# Patient Record
Sex: Female | Born: 1960 | Race: White | Hispanic: No | Marital: Married | Smoking: Never smoker
Health system: Southern US, Community
[De-identification: ages and names within clinical notes are randomized; demographics above are authoritative.]

## PROBLEM LIST (undated history)

## (undated) DIAGNOSIS — C801 Malignant (primary) neoplasm, unspecified: Secondary | ICD-10-CM

## (undated) DIAGNOSIS — N644 Mastodynia: Secondary | ICD-10-CM

## (undated) HISTORY — DX: Mastodynia: N64.4

## (undated) HISTORY — DX: Malignant (primary) neoplasm, unspecified: C80.1

---

## 2008-10-19 ENCOUNTER — Encounter: Payer: Self-pay | Admitting: Internal Medicine

## 2008-10-26 ENCOUNTER — Encounter: Payer: Self-pay | Admitting: Internal Medicine

## 2008-12-17 ENCOUNTER — Ambulatory Visit: Payer: Self-pay | Admitting: Internal Medicine

## 2008-12-17 DIAGNOSIS — J309 Allergic rhinitis, unspecified: Secondary | ICD-10-CM | POA: Insufficient documentation

## 2008-12-22 ENCOUNTER — Telehealth: Payer: Self-pay | Admitting: Internal Medicine

## 2009-01-24 ENCOUNTER — Ambulatory Visit: Payer: Self-pay | Admitting: Internal Medicine

## 2009-01-25 ENCOUNTER — Ambulatory Visit: Payer: Self-pay | Admitting: Cardiology

## 2009-01-25 ENCOUNTER — Ambulatory Visit: Payer: Self-pay | Admitting: Internal Medicine

## 2009-01-25 ENCOUNTER — Telehealth: Payer: Self-pay | Admitting: Internal Medicine

## 2009-01-26 ENCOUNTER — Ambulatory Visit: Payer: Self-pay | Admitting: Internal Medicine

## 2009-02-18 ENCOUNTER — Ambulatory Visit: Payer: Self-pay | Admitting: Internal Medicine

## 2009-02-21 ENCOUNTER — Telehealth (INDEPENDENT_AMBULATORY_CARE_PROVIDER_SITE_OTHER): Payer: Self-pay | Admitting: *Deleted

## 2009-03-10 ENCOUNTER — Telehealth (INDEPENDENT_AMBULATORY_CARE_PROVIDER_SITE_OTHER): Payer: Self-pay | Admitting: *Deleted

## 2009-03-16 ENCOUNTER — Ambulatory Visit: Payer: Self-pay | Admitting: Internal Medicine

## 2009-04-14 ENCOUNTER — Ambulatory Visit: Payer: Self-pay | Admitting: Internal Medicine

## 2009-04-19 ENCOUNTER — Encounter: Payer: Self-pay | Admitting: Internal Medicine

## 2009-04-21 ENCOUNTER — Encounter: Admission: RE | Admit: 2009-04-21 | Discharge: 2009-04-21 | Payer: Self-pay | Admitting: Family Medicine

## 2009-04-25 ENCOUNTER — Encounter (INDEPENDENT_AMBULATORY_CARE_PROVIDER_SITE_OTHER): Payer: Self-pay | Admitting: Diagnostic Radiology

## 2009-04-25 ENCOUNTER — Encounter: Admission: RE | Admit: 2009-04-25 | Discharge: 2009-04-25 | Payer: Self-pay | Admitting: Family Medicine

## 2009-04-25 ENCOUNTER — Ambulatory Visit: Payer: Self-pay | Admitting: Internal Medicine

## 2009-05-03 ENCOUNTER — Encounter: Admission: RE | Admit: 2009-05-03 | Discharge: 2009-05-03 | Payer: Self-pay | Admitting: Family Medicine

## 2009-05-03 ENCOUNTER — Encounter (INDEPENDENT_AMBULATORY_CARE_PROVIDER_SITE_OTHER): Payer: Self-pay | Admitting: Diagnostic Radiology

## 2009-05-11 ENCOUNTER — Ambulatory Visit: Payer: Self-pay | Admitting: Genetic Counselor

## 2009-05-25 ENCOUNTER — Encounter: Admission: RE | Admit: 2009-05-25 | Discharge: 2009-05-25 | Payer: Self-pay | Admitting: Surgery

## 2009-05-26 ENCOUNTER — Encounter (INDEPENDENT_AMBULATORY_CARE_PROVIDER_SITE_OTHER): Payer: Self-pay | Admitting: Surgery

## 2009-05-26 ENCOUNTER — Ambulatory Visit (HOSPITAL_BASED_OUTPATIENT_CLINIC_OR_DEPARTMENT_OTHER): Admission: RE | Admit: 2009-05-26 | Discharge: 2009-05-27 | Payer: Self-pay | Admitting: Surgery

## 2009-05-26 HISTORY — PX: MASTECTOMY: SHX3

## 2009-06-07 ENCOUNTER — Telehealth (INDEPENDENT_AMBULATORY_CARE_PROVIDER_SITE_OTHER): Payer: Self-pay | Admitting: *Deleted

## 2009-06-15 ENCOUNTER — Ambulatory Visit: Payer: Self-pay | Admitting: Internal Medicine

## 2009-06-15 ENCOUNTER — Ambulatory Visit: Payer: Self-pay | Admitting: Oncology

## 2009-06-15 LAB — VITAMIN D 25 HYDROXY (VIT D DEFICIENCY, FRACTURES): Vit D, 25-Hydroxy: 43 ng/mL (ref 30–89)

## 2009-06-15 LAB — COMPREHENSIVE METABOLIC PANEL
AST: 13 U/L (ref 0–37)
Albumin: 4.5 g/dL (ref 3.5–5.2)
BUN: 10 mg/dL (ref 6–23)
CO2: 25 mEq/L (ref 19–32)
Calcium: 9.8 mg/dL (ref 8.4–10.5)
Chloride: 105 mEq/L (ref 96–112)
Glucose, Bld: 91 mg/dL (ref 70–99)
Potassium: 4.2 mEq/L (ref 3.5–5.3)

## 2009-06-15 LAB — CBC WITH DIFFERENTIAL/PLATELET
Basophils Absolute: 0 10*3/uL (ref 0.0–0.1)
Eosinophils Absolute: 0.1 10*3/uL (ref 0.0–0.5)
HGB: 12.5 g/dL (ref 11.6–15.9)
MONO#: 0.5 10*3/uL (ref 0.1–0.9)
NEUT#: 7.1 10*3/uL — ABNORMAL HIGH (ref 1.5–6.5)
RDW: 13.1 % (ref 11.2–14.5)
lymph#: 1.7 10*3/uL (ref 0.9–3.3)

## 2009-06-15 LAB — LACTATE DEHYDROGENASE: LDH: 122 U/L (ref 94–250)

## 2009-06-15 LAB — CANCER ANTIGEN 27.29: CA 27.29: 22 U/mL (ref 0–39)

## 2009-06-23 HISTORY — PX: UTERINE FIBROID SURGERY: SHX826

## 2009-06-29 ENCOUNTER — Ambulatory Visit (HOSPITAL_COMMUNITY): Admission: RE | Admit: 2009-06-29 | Discharge: 2009-06-29 | Payer: Self-pay | Admitting: Oncology

## 2009-07-04 ENCOUNTER — Encounter: Payer: Self-pay | Admitting: Internal Medicine

## 2009-07-04 ENCOUNTER — Ambulatory Visit (HOSPITAL_COMMUNITY): Admission: RE | Admit: 2009-07-04 | Discharge: 2009-07-04 | Payer: Self-pay | Admitting: Oncology

## 2009-07-06 ENCOUNTER — Ambulatory Visit: Payer: Self-pay | Admitting: Internal Medicine

## 2009-08-01 ENCOUNTER — Ambulatory Visit: Payer: Self-pay | Admitting: Oncology

## 2009-08-03 ENCOUNTER — Encounter: Payer: Self-pay | Admitting: Internal Medicine

## 2009-08-03 LAB — CBC WITH DIFFERENTIAL/PLATELET
BASO%: 0.7 % (ref 0.0–2.0)
EOS%: 1.5 % (ref 0.0–7.0)
Eosinophils Absolute: 0.1 10*3/uL (ref 0.0–0.5)
MCHC: 33.7 g/dL (ref 31.5–36.0)
MCV: 87.1 fL (ref 79.5–101.0)
MONO%: 9.6 % (ref 0.0–14.0)
NEUT#: 3.5 10*3/uL (ref 1.5–6.5)
RBC: 4.26 10*6/uL (ref 3.70–5.45)
RDW: 13.1 % (ref 11.2–14.5)

## 2009-08-03 LAB — FOLLICLE STIMULATING HORMONE: FSH: 2.9 m[IU]/mL

## 2009-08-03 LAB — BASIC METABOLIC PANEL
CO2: 27 mEq/L (ref 19–32)
Calcium: 9.8 mg/dL (ref 8.4–10.5)
Sodium: 142 mEq/L (ref 135–145)

## 2009-08-11 LAB — ESTRADIOL, ULTRA SENS: Estradiol, Ultra Sensitive: 33 pg/mL

## 2009-08-31 ENCOUNTER — Ambulatory Visit: Payer: Self-pay | Admitting: Oncology

## 2009-09-02 LAB — COMPREHENSIVE METABOLIC PANEL
ALT: 16 U/L (ref 0–35)
CO2: 25 mEq/L (ref 19–32)
Calcium: 9.2 mg/dL (ref 8.4–10.5)
Chloride: 103 mEq/L (ref 96–112)
Creatinine, Ser: 0.67 mg/dL (ref 0.40–1.20)
Glucose, Bld: 128 mg/dL — ABNORMAL HIGH (ref 70–99)
Total Protein: 6.8 g/dL (ref 6.0–8.3)

## 2009-09-02 LAB — CBC WITH DIFFERENTIAL/PLATELET
Basophils Absolute: 0 10*3/uL (ref 0.0–0.1)
EOS%: 1.7 % (ref 0.0–7.0)
HGB: 13.5 g/dL (ref 11.6–15.9)
MCH: 29.2 pg (ref 25.1–34.0)
MCV: 86.6 fL (ref 79.5–101.0)
MONO%: 7.9 % (ref 0.0–14.0)
RBC: 4.62 10*6/uL (ref 3.70–5.45)
RDW: 12.9 % (ref 11.2–14.5)

## 2009-09-02 LAB — CANCER ANTIGEN 27.29: CA 27.29: 13 U/mL (ref 0–39)

## 2009-09-02 LAB — LACTATE DEHYDROGENASE: LDH: 129 U/L (ref 94–250)

## 2009-09-13 LAB — ESTRADIOL, ULTRA SENS: Estradiol, Ultra Sensitive: 2 pg/mL

## 2009-09-28 ENCOUNTER — Ambulatory Visit: Payer: Self-pay | Admitting: Oncology

## 2010-07-03 IMAGING — CT CT ABDOMEN W/ CM
2 of 8 series · 14 of 46 positions shown, 18 images · IV contrast (agent unspecified)
Comparison: None

CT CHEST

CLINICAL DATA: Right-sided breast cancer, status post right
mastectomy.

CT CHEST, ABDOMEN AND PELVIS WITH CONTRAST
TECHNIQUE: Multidetector CT imaging of the chest, abdomen and
pelvis was performed following the standard protocol during bolus
administration of intravenous contrast.
Contrast: 100 ml Wmnipaque-XXX

[Series 2: cap with st · axial · 0.82mm/px · z∈[-628,-38]mm · 11 of 134 slices shown, 15 images]
[im 8/134  soft-tissue]
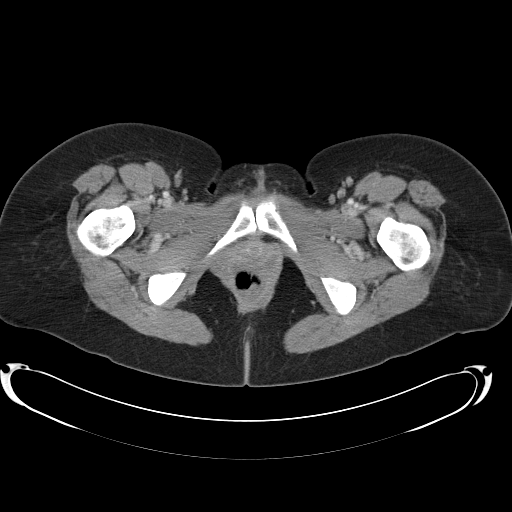
[im 8/134  bone]
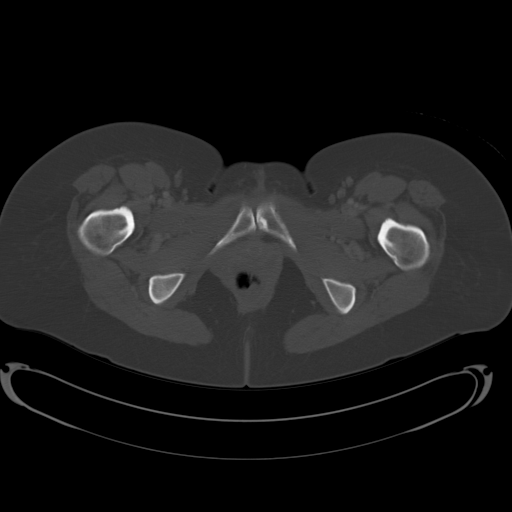
[im 24/134  soft-tissue]
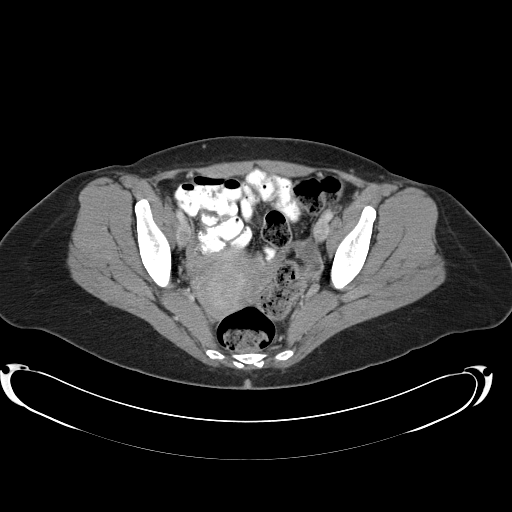
[im 40/134  soft-tissue]
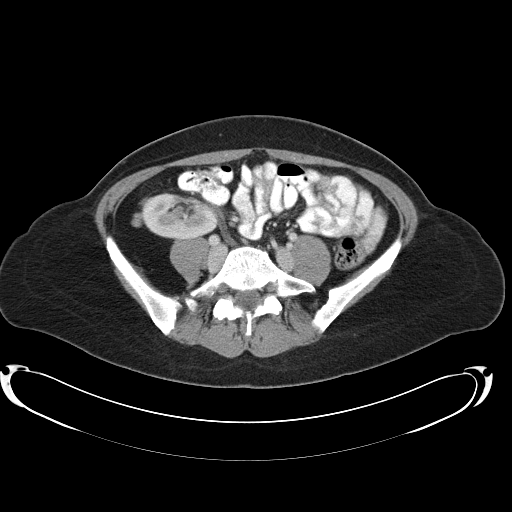
[im 55/134  soft-tissue]
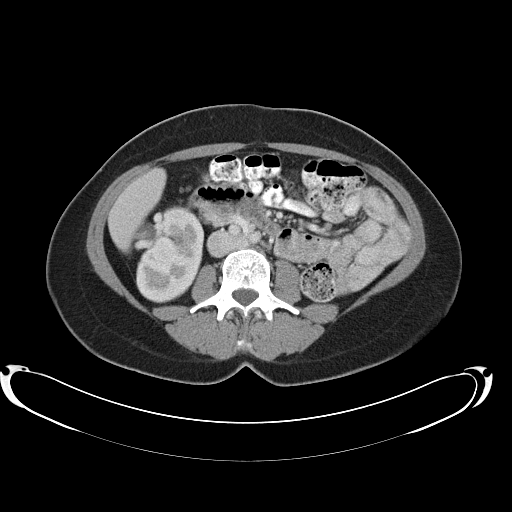
[im 71/134  soft-tissue]
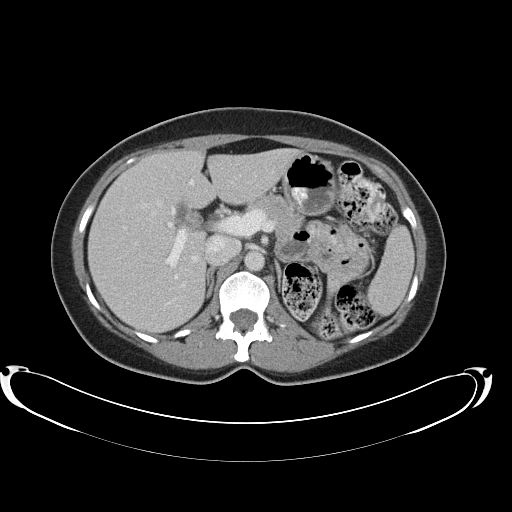
[im 79/134  soft-tissue]
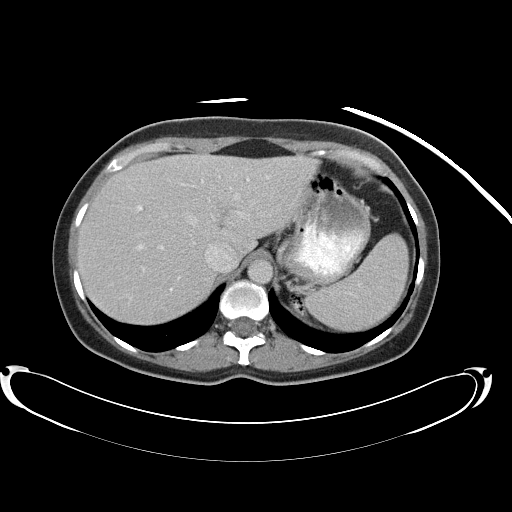
[im 94/134  soft-tissue]
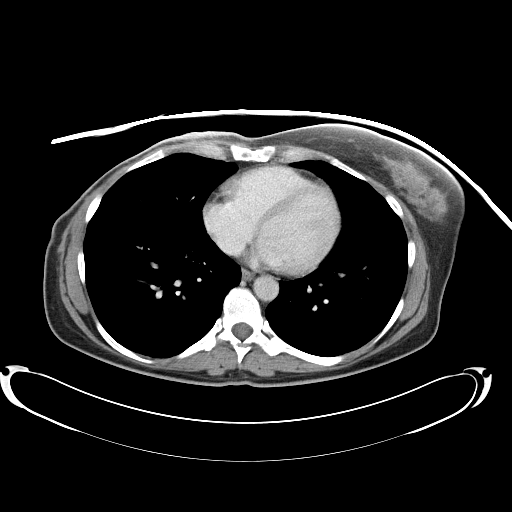
[im 102/134  lung]
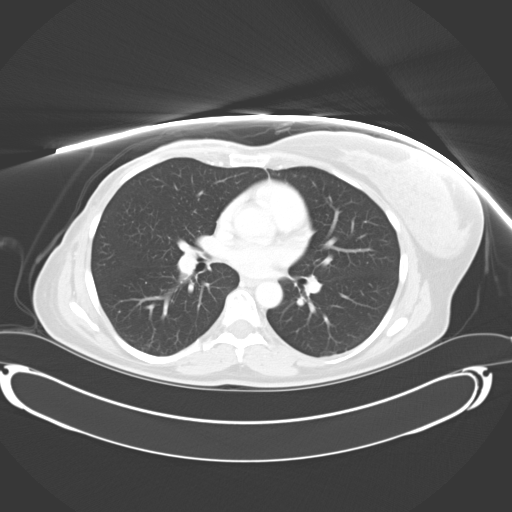
[im 110/134  soft-tissue]
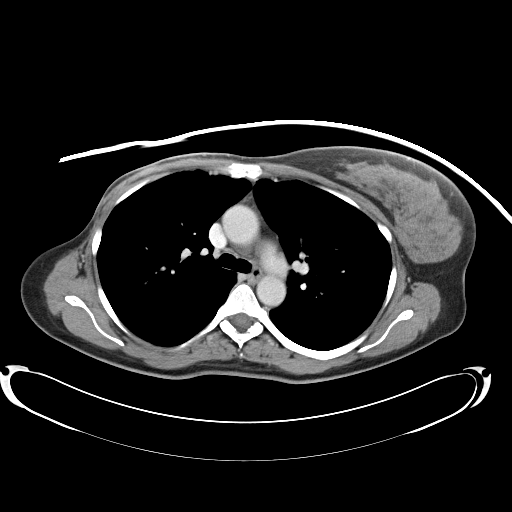
[im 110/134  lung]
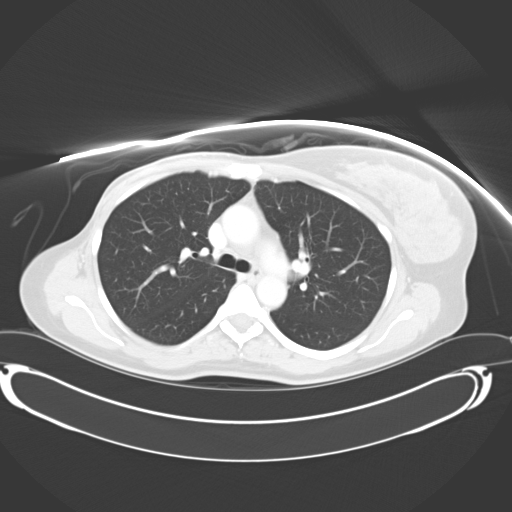
[im 118/134  lung]
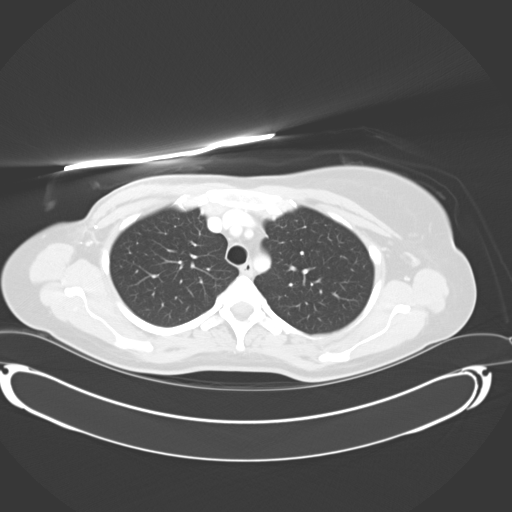
[im 126/134  soft-tissue]
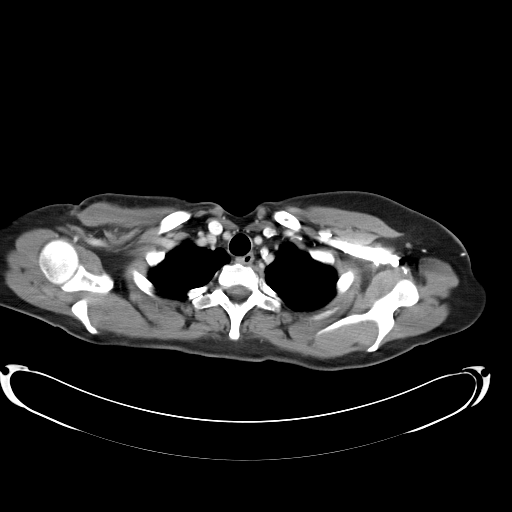
[im 126/134  lung]
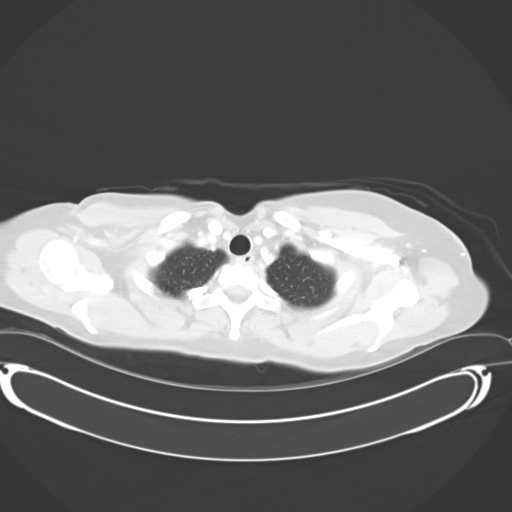
[im 126/134  bone]
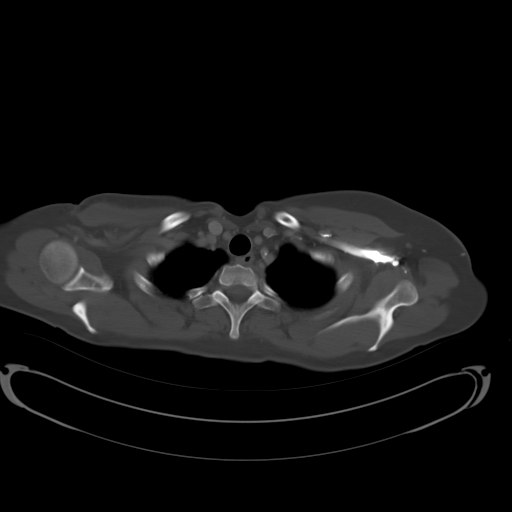

[Series 602: <mpr thick range> · coronal · 1.03mm/px · 3 of 82 slices shown]
[im 21/82  soft-tissue]
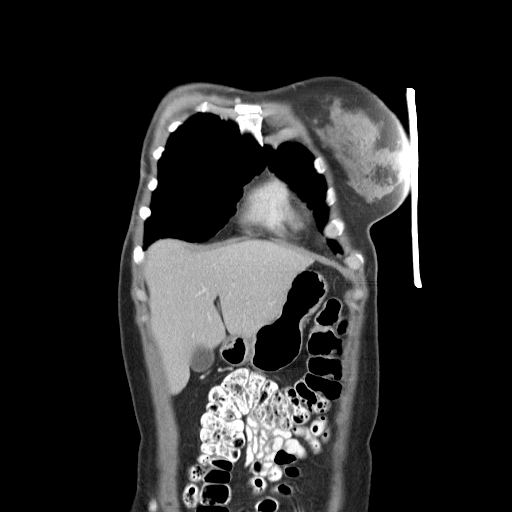
[im 41/82  soft-tissue]
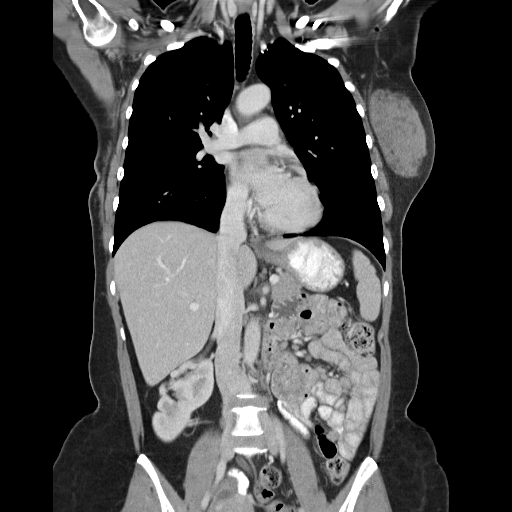
[im 61/82  soft-tissue]
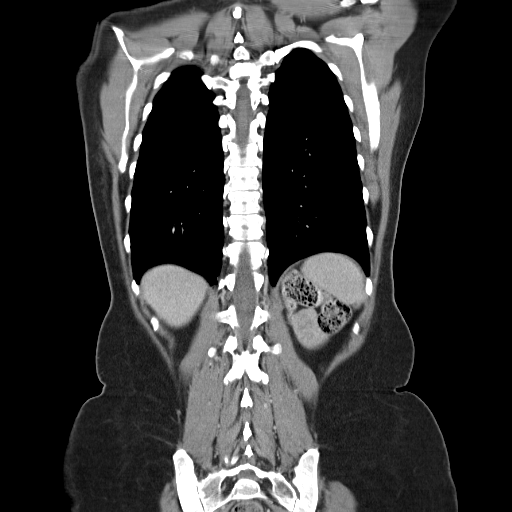

[14 of 46 positions shown; findings below may reference images not displayed]

FINDINGS: No pathologic thoracic adenopathy noted.  Right
mastectomy noted with right axillary clips.  No osseous thoracic
metastatic disease evident.  No lung mass identified.

No vascular abnormality is identified.
IMPRESSION: 1.  Right mastectomy.  No findings of residual or metastatic
malignancy.

CT ABDOMEN
FINDINGS: The liver, spleen, pancreas, and adrenal glands appear
unremarkable.

The gallbladder and biliary system appear unremarkable.

No pathologic retroperitoneal or porta hepatis adenopathy is
identified.

There is cross fused renal ectopia on the right side, without
current hydronephrosis or complicating feature.  The ureter of the
left kidney extends over to the left side in the pelvis.
IMPRESSION: 1.  No findings of metastatic disease to the upper abdomen.
2.  Crossed fused renal ectopia, right side.

CT PELVIS
FINDINGS: The appendix appears unremarkable.  Several small
Nabothian cysts are present.

The endometrial stripe measures up to 1.3 cm, and contains a 6 mm
focus of increased density on image 52 of series 606 which could
represent a polyp, small submucosal fibroid, or blood products.  If
clinically warranted, follow-up pelvic sonography in 6 weeks time
could be utilized for further characterization.

The urinary bladder appears unremarkable.  No free pelvic fluid
identified.

No pathologic pelvic adenopathy is identified.
IMPRESSION: 1.  Mildly thickened endometrial stripe with a small focus of
increased density within the endometrium - while this could
represent a small focus of blood products in the late secretory
phase, polyp or a small submucosal fibroid could appear similarly.

## 2010-10-23 ENCOUNTER — Encounter: Payer: Self-pay | Admitting: Family Medicine

## 2010-10-31 NOTE — Miscellaneous (Signed)
Summary: Injection Record/[facility]  Injection Record/[facility]   Imported By: Sherian Rein 02/21/2010 12:23:13  _____________________________________________________________________  External Attachment:    Type:   Image     Comment:   External Document

## 2010-10-31 NOTE — Miscellaneous (Signed)
Summary: Injection Orders / [facility]    Injection Orders / [facility]    Imported By: Lennie Odor 02/28/2010 16:08:14  _____________________________________________________________________  External Attachment:    Type:   Image     Comment:   External Document

## 2011-01-06 LAB — COMPREHENSIVE METABOLIC PANEL
ALT: 17 U/L (ref 0–35)
AST: 16 U/L (ref 0–37)
Albumin: 4.6 g/dL (ref 3.5–5.2)
Alkaline Phosphatase: 52 U/L (ref 39–117)
BUN: 10 mg/dL (ref 6–23)
CO2: 28 mEq/L (ref 19–32)
Creatinine, Ser: 0.56 mg/dL (ref 0.4–1.2)
Potassium: 4.3 mEq/L (ref 3.5–5.1)
Sodium: 137 mEq/L (ref 135–145)
Total Protein: 7.1 g/dL (ref 6.0–8.3)

## 2011-01-06 LAB — URINALYSIS, ROUTINE W REFLEX MICROSCOPIC
Nitrite: NEGATIVE
Specific Gravity, Urine: 1.02 (ref 1.005–1.030)

## 2011-01-06 LAB — DIFFERENTIAL
Basophils Absolute: 0 10*3/uL (ref 0.0–0.1)
Eosinophils Absolute: 0.1 10*3/uL (ref 0.0–0.7)
Eosinophils Relative: 1 % (ref 0–5)
Monocytes Relative: 7 % (ref 3–12)
Neutro Abs: 4.4 10*3/uL (ref 1.7–7.7)
Neutrophils Relative %: 64 % (ref 43–77)

## 2011-01-06 LAB — CBC
Hemoglobin: 12.9 g/dL (ref 12.0–15.0)
MCV: 87.9 fL (ref 78.0–100.0)
Platelets: 243 10*3/uL (ref 150–400)
RBC: 4.31 MIL/uL (ref 3.87–5.11)
WBC: 6.9 10*3/uL (ref 4.0–10.5)

## 2011-02-13 NOTE — Op Note (Signed)
NAMEJANITA, CAMBEROS             ACCOUNT NO.:  [id number]   MEDICAL RECORD NO.:  [id number]          PATIENT TYPE:  AMB   LOCATION:  [facility]                          FACILITY:  [facility]   PHYSICIAN:  Currie Paris, M.D.DATE OF BIRTH:  1961-07-21   DATE OF PROCEDURE:  05/26/2009  DATE OF DISCHARGE:                               OPERATIVE REPORT   PREOPERATIVE DIAGNOSIS:  Multifocal right breast cancer.   POSTOPERATIVE DIAGNOSIS:  Multifocal right breast cancer.   OPERATION:  Right modified mastectomy with blue dye injection and  sentinel lymph node biopsy.   SURGEON:  Currie Paris, MD   ANESTHESIA:  General.   CLINICAL HISTORY:  This is a 50 year old lady with a multifocal right  breast cancer.  Mastectomy was discussed and she agreed to proceed.  We  planned sentinel lymph node evaluation with possible node dissection.   DESCRIPTION OF PROCEDURE:  I saw the patient in the holding area and she  had no further questions.  Her right breast had already been injected  with radioactive isotope.  She and her family had no further questions.   The patient was taken to the operating room after satisfactory general  anesthesia had been obtained.  The time-out was done.  I injected 5 mL  of dilute methylene blue subareolarly.  The breast was then prepped and  draped.  I outlined an elliptical incision.  No reconstruction was  planned, so I tried to not leave any excess skin but to leave enough  should she have a delayed reconstruction.   The NeoProbe was used to identify a hot area in the axilla and the skin  over that was marked.   I made the superior incision and raised skin flaps medially to the  sternum, superiorly towards the clavicle and then laterally until I got  into the axilla and towards the latissimus.  At that point, I stopped  and used the Neoprobe to identify a hot area and further dissection  showed a large 2.5-cm lymph node that was bright blue and had counts  of  over 5000.  This was removed.  Two additional lymph nodes adjacent were  found, one of which had counts of 2000 and the other about 800.  These  were sent for touch preps.  I did not see any other hot nodes.   At this point, I made the inferior incision and raised the inferior skin  flap out to the inframammary fold and then laterally to latissimus.  The  breast was then removed starting medially and working laterally taking  the fascia.  I opened the clavipectoral fascia and at this point Dr.  Laureen Ochs reported that the large lymph node was positive and the other two  were therefore not checked.  With this knowledge, I continued to do the  axillary dissection.   The entire clavipectoral fascia was opened.  The attachments of the  breast to the serratus were divided and then as they entered the axilla  I dissected around so I could identify the axillary vein and then  stripped the axillary contents from medial to  lateral, superior to  inferior.  The second intercostal nerve was clipped and divided.  I  tried to preserve the blood and nerve supply to the pectoralis.  I  identified and preserved the long thoracic and thoracodorsal nerves.  I  dissected out the intervening tissue.  I detached the lateral  attachments of the breast towards the axilla making a tiny buttonhole in  the axilla.  The final lateral attachments to the latissimus were  divided and the specimen was taken off the table.   I checked and the nerves appeared to be working okay when stimulated.  I  put some Marcaine around them.  I irrigated.  I put a 19 Blake drain in  for the superior flap and one towards the axilla.  We irrigated and  again checked for hemostasis and everything was dry.  I put some Tisseel  along the chest wall and up into the axilla and then closed with staples  and Steri-Strips and secured the drains with 2-0 nylon.  I did put a  little Vicryl suture in the tiny buttonhole as that was causing an  air  leak.   Sterile dry dressings were applied.  The patient tolerated the procedure  well.  There were no operative complications.  Estimated blood loss was  about 100 mL.      Currie Paris, M.D.  Electronically Signed     CJS/MEDQ  D:  05/26/2009  T:  05/26/2009  Job:  [id number]   cc:   Jeanmarie Plant

## 2011-02-21 ENCOUNTER — Encounter: Payer: Self-pay | Admitting: Gastroenterology

## 2011-03-28 DIAGNOSIS — C50919 Malignant neoplasm of unspecified site of unspecified female breast: Secondary | ICD-10-CM | POA: Insufficient documentation

## 2011-03-30 ENCOUNTER — Telehealth (INDEPENDENT_AMBULATORY_CARE_PROVIDER_SITE_OTHER): Payer: Self-pay | Admitting: Surgery

## 2011-03-30 NOTE — Telephone Encounter (Signed)
I spoke with the patient today about her biopsy result. She was already aware that she has a new left breast cancer with axillary metastasis.  I told her that I would get up with her medical oncologist on Monday. We will need to get some input from her before making a decision on our next steps. I think she is likely to need some scans to rule out metastatic disease. In addition the prognostic indicators are still pending.

## 2011-04-08 ENCOUNTER — Telehealth (INDEPENDENT_AMBULATORY_CARE_PROVIDER_SITE_OTHER): Payer: Self-pay | Admitting: Surgery

## 2011-04-08 NOTE — Telephone Encounter (Signed)
I spoke with Dr.McCarty. The patient wishes to proceed to a modified mastectomy and, if possible a PAC. I told Dr Gilman Buttner we would try to schedule this week, but I might have to have one of my associates do the surgery. We will call the patient Monday with definitive plans

## 2011-04-09 ENCOUNTER — Telehealth (INDEPENDENT_AMBULATORY_CARE_PROVIDER_SITE_OTHER): Payer: Self-pay | Admitting: Surgery

## 2011-04-09 NOTE — Telephone Encounter (Signed)
I spoke with the patient today. She is scheduled for a modified mastectomy and Port-A-Cath placement on Wednesday. She went over the plans for that in detail with her oncologist last week. I called her to confirm the plans, and to be sure she had no further questions. She did not and fully understands the plans.

## 2011-04-10 ENCOUNTER — Other Ambulatory Visit (INDEPENDENT_AMBULATORY_CARE_PROVIDER_SITE_OTHER): Payer: Self-pay | Admitting: Surgery

## 2011-04-10 ENCOUNTER — Encounter (HOSPITAL_COMMUNITY): Payer: BC Managed Care – PPO

## 2011-04-10 LAB — BASIC METABOLIC PANEL
BUN: 10 mg/dL (ref 6–23)
CO2: 28 mEq/L (ref 19–32)
Calcium: 10 mg/dL (ref 8.4–10.5)
GFR calc non Af Amer: >60 mL/min (ref >60)
Glucose, Bld: 90 mg/dL (ref 70–99)
Potassium: 3.7 mEq/L (ref 3.5–5.1)

## 2011-04-10 LAB — URINALYSIS, ROUTINE W REFLEX MICROSCOPIC
Hgb urine dipstick: NEGATIVE
Ketones, ur: NEGATIVE mg/dL
Leukocytes, UA: NEGATIVE
Protein, ur: NEGATIVE mg/dL
Urobilinogen, UA: 0.2 mg/dL (ref 0.0–1.0)
pH: 7.5 (ref 5.0–8.0)

## 2011-04-10 LAB — SURGICAL PCR SCREEN: Staphylococcus aureus: NEGATIVE

## 2011-04-10 LAB — DIFFERENTIAL
Lymphs Abs: 1.8 10*3/uL (ref 0.7–4.0)
Monocytes Absolute: 0.5 10*3/uL (ref 0.1–1.0)
Neutro Abs: 5.1 10*3/uL (ref 1.7–7.7)

## 2011-04-10 LAB — CBC
HCT: 39.2 % (ref 36.0–46.0)
MCV: 85.6 fL (ref 78.0–100.0)
Platelets: 276 10*3/uL (ref 150–400)
RBC: 4.58 MIL/uL (ref 3.87–5.11)
RDW: 13.2 % (ref 11.5–15.5)

## 2011-04-11 ENCOUNTER — Ambulatory Visit (HOSPITAL_COMMUNITY): Payer: BC Managed Care – PPO

## 2011-04-11 ENCOUNTER — Other Ambulatory Visit (INDEPENDENT_AMBULATORY_CARE_PROVIDER_SITE_OTHER): Payer: Self-pay | Admitting: Surgery

## 2011-04-11 ENCOUNTER — Ambulatory Visit (HOSPITAL_COMMUNITY)
Admission: RE | Admit: 2011-04-11 | Discharge: 2011-04-12 | Disposition: A | Discharge status: Home / Self Care | Payer: BC Managed Care – PPO | Source: Ambulatory Visit | Attending: Surgery | Admitting: Surgery

## 2011-04-11 DIAGNOSIS — Z01812 Encounter for preprocedural laboratory examination: Secondary | ICD-10-CM | POA: Insufficient documentation

## 2011-04-11 DIAGNOSIS — C50219 Malignant neoplasm of upper-inner quadrant of unspecified female breast: Secondary | ICD-10-CM | POA: Insufficient documentation

## 2011-04-11 DIAGNOSIS — Z79899 Other long term (current) drug therapy: Secondary | ICD-10-CM | POA: Insufficient documentation

## 2011-04-11 DIAGNOSIS — C50919 Malignant neoplasm of unspecified site of unspecified female breast: Secondary | ICD-10-CM

## 2011-04-11 DIAGNOSIS — Z01810 Encounter for preprocedural cardiovascular examination: Secondary | ICD-10-CM | POA: Insufficient documentation

## 2011-04-11 HISTORY — PX: MASTECTOMY MODIFIED RADICAL: SUR848

## 2011-04-18 ENCOUNTER — Encounter (INDEPENDENT_AMBULATORY_CARE_PROVIDER_SITE_OTHER): Payer: Self-pay | Admitting: General Surgery

## 2011-04-19 ENCOUNTER — Encounter (INDEPENDENT_AMBULATORY_CARE_PROVIDER_SITE_OTHER): Payer: Self-pay | Admitting: General Surgery

## 2011-04-19 ENCOUNTER — Ambulatory Visit (INDEPENDENT_AMBULATORY_CARE_PROVIDER_SITE_OTHER): Payer: BC Managed Care – PPO | Admitting: General Surgery

## 2011-04-19 VITALS — Temp 97.6°F | Wt 145.2 lb

## 2011-04-19 DIAGNOSIS — C50919 Malignant neoplasm of unspecified site of unspecified female breast: Secondary | ICD-10-CM

## 2011-04-19 NOTE — Progress Notes (Signed)
Subjective:     Patient ID: Jodi Middleton, female   DOB: 1961-06-23, 50 y.o.   MRN: [id number]  HPI Followup breast cancer left breast and Port-A-Cath placement by Dr. Darylene Price approximately 8 days ago with   Review of Systems     Objective:   Physical Exam  Patient has had a recent mastectomy on the left with tube mastectomy drains the medial drain is draining approximately 20 cc of fluid in the last several days its clear there's no evidence of any obvious infection remove the medial drain. The lateral drain is draining clear fluid and larger quantities approximately 45 cc yesterday and I will plan on removing the drain next week. Its were replaced as a loop that of ischemia on the superior flap laterally at her foot will not be any full thickness skin loss. Her Port-A-Cath placement on the right is healing nicely and she is keeping incision dry   Assessment:        Plan:    Return in one week for removal of the left drain. I discussed with the patient she had 305 positive lymph nodes this time and she had 3 positive lymph nodes on the right breast approximately 2 years ago when she had a mastectomy on that she. She is not plan on a reconstruction incision appears to be healing satisfactory.

## 2011-04-19 NOTE — Patient Instructions (Signed)
Please keep the recording the drainage from the left axillary drain and keep incision dry and our plan our plan on seeing you in one week but if the drainage would decrease over the weekend rapidly I could see earlier to remove the left axillary drain.

## 2011-04-27 ENCOUNTER — Ambulatory Visit (INDEPENDENT_AMBULATORY_CARE_PROVIDER_SITE_OTHER): Payer: BC Managed Care – PPO | Admitting: General Surgery

## 2011-04-27 ENCOUNTER — Encounter (INDEPENDENT_AMBULATORY_CARE_PROVIDER_SITE_OTHER): Payer: Self-pay | Admitting: Surgery

## 2011-04-27 ENCOUNTER — Encounter (INDEPENDENT_AMBULATORY_CARE_PROVIDER_SITE_OTHER): Payer: Self-pay | Admitting: General Surgery

## 2011-04-27 VITALS — BP 104/80 | HR 78 | Temp 98.3°F

## 2011-04-27 DIAGNOSIS — C50919 Malignant neoplasm of unspecified site of unspecified female breast: Secondary | ICD-10-CM

## 2011-04-27 NOTE — Progress Notes (Signed)
Subjective:     Patient ID: Jodi Middleton, female   DOB: April 01, 1961, 50 y.o.   MRN: [id number]  HPIThis patient returns she is a patient of Dr. strikes who had a left mastectomy and axillary dissection approximately 2 weeks ago removed one of the Blake drains last week and she returns for removal of the second drain today. She says that she is having a small amount of air leakage but there is very little fluid within the drain reservoir and I removed the tube without problems. His a few little crusted areas along the mastectomy incision that she will apply a little Neosporin to the incision. She is supposed to start her chemotherapy next week Thursday or Friday and applied this with Dr. strikes fearful one postop visit prior to starting the chemotherapy and she may need aspiration of the axilla   Review of Systems     Objective:   Physical Exam     Assessment:         Plan:     Patient may shower starting tomorrow and apply a little Neosporin to the incision for the drain has been removed. She will return to see Dr. Consuella Lose next week for wound check and discussion of her tumor he is to start chemotherapy next Thursday or Friday by her medical oncologist

## 2011-04-27 NOTE — Patient Instructions (Signed)
He may start showering tomorrow and apply a little Neosporin to the incision where the drain was removed and the fluid scabs on the mastectomy incision

## 2011-04-27 NOTE — Op Note (Signed)
NAMECATELYN, FRIEL NO.:  [id number]  MEDICAL RECORD NO.:  [id number]  LOCATION:  [facility]                         FACILITY:  [facility]  PHYSICIAN:  Currie Paris, M.D.DATE OF BIRTH:  [birth year]  DATE OF PROCEDURE:  04/11/2011 DATE OF DISCHARGE:                              OPERATIVE REPORT   PREOPERATIVE DIAGNOSIS:  Invasive carcinoma, left breast, upper inner quadrant, stage II.  POSTOPERATIVE DIAGNOSIS:  Invasive carcinoma, left breast, upper inner quadrant, stage II.  PROCEDURE:  Left modified radical mastectomy with port placement.  SURGEON:  Currie Paris, M.D.  ANESTHESIA:  General.  CLINICAL HISTORY:  This is a 50 year old lady who is about 2 years out from a right mastectomy and sentinel node evaluation for a multifocal right breast cancer.  She presented to my office recently for routine followup and was found to have a large mass in the upper inner quadrant of the left breast.  A recent mammogram apparently had been normal, but further evaluation showed this to be an invasive carcinoma and an axillary metastasis was found on biopsy.  After discussion with her oncologist, she elected to proceed directly to a modified mastectomy and port placement for postoperative chemotherapy.  DESCRIPTION OF PROCEDURE:  I saw the patient in the holding area and reviewed the plans as noted and she had no further questions.  I counseled her over the telephone as well prior to surgery.  The patient was taken to the operating room and after satisfactory general (LMA) anesthesia had been obtained, the entire upper chest and lower neck were prepped and draped as a single sterile field.  The time- out was done.  I made an elliptical transverse incision.  This was oriented a little bit from superomedial to inferolateral leaving the tip of the incision higher than I would have liked but this was the only way I thought that I could be sure that we had good  margins over the tumor which was fairly high medial in the upper inner quadrant.  I raised the usual skin flaps to sternum, clavicle, inframammary fold, and to the latissimus.  This was all done with cautery.  The breast was then removed from the chest wall using cautery and taking the fascia. When I got to the edge of the pectoralis, I opened the clavipectoral fascia.  I was able to identify the axillary vein and then sweep the contents out starting where I thought was top of the axilla and sweeping the contents laterally.  I could feel some nodes that felt involved.  I did take the second intercostal nerve but I found and identified both the long thoracic and thoracodorsal nerves and they were preserved. This __________ down, I then disconnected it from the lateral attachments to latissimus.  In palpating, I could feel there was yet one more node a little bit higher than I had gotten and this felt involved and was really right attached to the axillary vein.  This was carefully dissected out trimming it off the vein to be sure we did not injury the vein.  I used some clips here as well.  This was sent separately and grossly appeared to involve tumor.  I  irrigated and made sure everything was dry.  I put some Marcaine around the 2 nerves and then placed two 19 Blake drains.  A final irrigation was made and then the incision closed with 3-0 Vicryl, 4-0 Monocryl subcuticular, Steri-Strips.  Using new gloves, instruments counts, etc., I approached the right side. The patient was placed in Trendelenburg.  I initially attempted the end of the subclavian vein, but I was a little too lateral side, so I moved slightly medially and got in easily and threaded the guidewire.  This initially went up into the internal jugular but using fluoro, I manipulated it back and then into the superior vena cava.  I made a transverse incision over her prior mastectomy flap and made a small pocket.  I used  the PowerPort slim, which was a smaller port since I thought the standard size would be very bulky under this very thin skin flap.  I then brought the tubing from the port site into the guidewire site. Using fluoro, I was able to dilate the guidewire tract and thread the catheter through to about 22 cm and removed the peel-away sheath.  It aspirated and flushed easily.  Fluoro showed me to be in the right atrial area, so I backed it out to about 16 cm where I appeared to be in the distal SVC.  It aspirated and flushed easily.  The reservoir was flushed, attached and locking mechanism engaged.  This aspirated and flushed easily.  I put some contrast and made a final check to make sure everything was well positioned and then flushed the catheter with concentrated aqueous heparin.  The port was secured with some Prolene to the fascia.  The incision was closed with 3-0 Vicryl, 4-0 Monocryl subcuticular and Dermabond.  The patient tolerated the procedure well.  There were no complications. All counts were correct.     Currie Paris, M.D.     CJS/MEDQ  D:  04/11/2011  T:  04/11/2011  Job:  [id number]  cc:   Dellia Beckwith, M.D. Fax: [phone]  Electronically Signed by Cyndia Bent M.D. on 04/27/2011 07:22:02 AM

## 2011-05-04 ENCOUNTER — Ambulatory Visit (INDEPENDENT_AMBULATORY_CARE_PROVIDER_SITE_OTHER): Payer: BC Managed Care – PPO | Admitting: Surgery

## 2011-05-04 ENCOUNTER — Encounter (INDEPENDENT_AMBULATORY_CARE_PROVIDER_SITE_OTHER): Payer: Self-pay | Admitting: Surgery

## 2011-05-04 VITALS — BP 115/70 | HR 70 | Temp 98.1°F

## 2011-05-04 DIAGNOSIS — Z853 Personal history of malignant neoplasm of breast: Secondary | ICD-10-CM

## 2011-05-04 DIAGNOSIS — Z9889 Other specified postprocedural states: Secondary | ICD-10-CM

## 2011-05-04 NOTE — Patient Instructions (Signed)
Come back in about two weeks for another post op visit. Begin shoulder exercises

## 2011-05-04 NOTE — Progress Notes (Signed)
CC: Postop left mastectomy  HPI: This patient comes in for post op follow-up. She underwent left modified radical on July 11. She feels that she is doing well. She does note that she still has some limited range of motion of the left shoulder. She doesn't think she has developed any fluid under her skin flaps.  PE: General: The patient appears to be healthy, NAD  The breast incision is healing nicely. There is no fluid present. There are some "scabs "but no evidence of skin necrosis.  IMPRESSION: The patient is doing well S/P modified radical mastectomy.  DATA REVIWED: Reviewed her past again  PLAN: I will see her back in two weeks. We gave her instructions for range of motion exercises. Her from my perspective she could work when she felt able but she needs to be careful because of the chemotherapy.

## 2011-05-23 ENCOUNTER — Ambulatory Visit (INDEPENDENT_AMBULATORY_CARE_PROVIDER_SITE_OTHER): Payer: BC Managed Care – PPO | Admitting: Surgery

## 2011-05-23 ENCOUNTER — Encounter (INDEPENDENT_AMBULATORY_CARE_PROVIDER_SITE_OTHER): Payer: Self-pay | Admitting: Surgery

## 2011-05-23 VITALS — BP 138/80 | HR 80

## 2011-05-23 DIAGNOSIS — C50919 Malignant neoplasm of unspecified site of unspecified female breast: Secondary | ICD-10-CM

## 2011-05-23 DIAGNOSIS — Z9889 Other specified postprocedural states: Secondary | ICD-10-CM

## 2011-05-23 NOTE — Progress Notes (Signed)
CC: Postop left mastectomy  HPI: This patient comes in for post op follow-up. She underwent left modified radical on July 11. She feels that she is doing well. She does note that she has no limited range of motion of the left shoulder. She doesn't think she has developed any fluid under her skin flaps. She is having a tough time with chemo PE: General: The patient appears to be healthy, NAD  The breast incision is healing nicely. There is no fluid present. There are some "scabs "but no evidence of skin necrosis.  IMPRESSION: The patient is doing well S/P modified radical mastectomy.  DATA REVIWED: Reviewed oncologistt notes  PLAN: Gave her Rx for wig

## 2011-10-01 ENCOUNTER — Other Ambulatory Visit: Payer: Self-pay | Admitting: Oncology

## 2011-10-01 DIAGNOSIS — C50919 Malignant neoplasm of unspecified site of unspecified female breast: Secondary | ICD-10-CM

## 2011-10-04 ENCOUNTER — Encounter (HOSPITAL_COMMUNITY): Payer: Self-pay

## 2011-10-04 ENCOUNTER — Encounter (HOSPITAL_COMMUNITY)
Admission: RE | Admit: 2011-10-04 | Discharge: 2011-10-04 | Disposition: A | Discharge status: Home / Self Care | Payer: BC Managed Care – PPO | Source: Ambulatory Visit | Attending: Oncology | Admitting: Oncology

## 2011-10-04 DIAGNOSIS — Q638 Other specified congenital malformations of kidney: Secondary | ICD-10-CM | POA: Insufficient documentation

## 2011-10-04 DIAGNOSIS — C50919 Malignant neoplasm of unspecified site of unspecified female breast: Secondary | ICD-10-CM | POA: Insufficient documentation

## 2011-10-04 LAB — GLUCOSE, CAPILLARY: Glucose-Capillary: 84 mg/dL (ref 70–99)

## 2011-10-04 MED ORDER — FLUDEOXYGLUCOSE F - 18 (FDG) INJECTION
18.6000 | Freq: Once | INTRAVENOUS | Status: AC | PRN
  Administered 2011-10-04: 18.6 via INTRAVENOUS

## 2012-04-29 ENCOUNTER — Ambulatory Visit (INDEPENDENT_AMBULATORY_CARE_PROVIDER_SITE_OTHER): Payer: BC Managed Care – PPO | Admitting: Surgery

## 2012-04-29 ENCOUNTER — Encounter (INDEPENDENT_AMBULATORY_CARE_PROVIDER_SITE_OTHER): Payer: Self-pay | Admitting: Surgery

## 2012-04-29 VITALS — BP 124/80 | HR 80 | Resp 16 | Ht 65.0 in | Wt 160.0 lb

## 2012-04-29 DIAGNOSIS — Z853 Personal history of malignant neoplasm of breast: Secondary | ICD-10-CM

## 2012-04-29 NOTE — Patient Instructions (Signed)
We will schedule surgery to remove your port using local anesthesia

## 2012-04-29 NOTE — Progress Notes (Signed)
NAME: Jodi Middleton       DOB: 03/25/1961           DATE: 04/29/2012       MRN: [id number]   Jodi Middleton is a 51 y.o..female who presents for routine followup of her Stage II right breast cancer and stage II left breast cancer diagnosed in 2010 and 2012 respectively and treated with Mastectomies. She has no problems or concerns on either side.She has completed her chemotherapy and her hair has started to regrow. She would like to have her port removed.  PFSH: She has had no significant changes since the last visit here.  ROS: There have been no significant changes since the last visit here  EXAM:  VS: BP 124/80  Pulse 80  Resp 16  Ht 5' 5" (1.651 m)  Wt 160 lb (72.576 kg)  BMI 26.63 kg/m2   General: The patient is alert, oriented, generally healthy appearing, NAD. Mood and affect are normal.  Breasts:  Status post bilateral mastectomies. There is no evidence of local recurrence is.  Lymphatics: She has no axillary or supraclavicular adenopathy on either side.  Extremities: Full ROM of the surgical side with no lymphedema noted.  Data Reviewed: No new data  Impression: Doing well, with no evidence of recurrent cancer or new cancer  Plan: Will continue to follow up on an annual basis here.We'll schedule Port-A-Cath removal under local anesthesia at the patient's convenience   

## 2012-04-30 ENCOUNTER — Ambulatory Visit (HOSPITAL_BASED_OUTPATIENT_CLINIC_OR_DEPARTMENT_OTHER)
Admission: RE | Admit: 2012-04-30 | Discharge: 2012-04-30 | Disposition: A | Discharge status: Home / Self Care | Payer: BC Managed Care – PPO | Source: Ambulatory Visit | Attending: Surgery | Admitting: Surgery

## 2012-04-30 ENCOUNTER — Encounter (HOSPITAL_BASED_OUTPATIENT_CLINIC_OR_DEPARTMENT_OTHER)
Admission: RE | Disposition: A | Discharge status: Home / Self Care | Payer: Self-pay | Source: Ambulatory Visit | Attending: Surgery

## 2012-04-30 DIAGNOSIS — Z9010 Acquired absence of unspecified breast and nipple: Secondary | ICD-10-CM | POA: Insufficient documentation

## 2012-04-30 DIAGNOSIS — Z452 Encounter for adjustment and management of vascular access device: Secondary | ICD-10-CM | POA: Insufficient documentation

## 2012-04-30 DIAGNOSIS — Z853 Personal history of malignant neoplasm of breast: Secondary | ICD-10-CM | POA: Insufficient documentation

## 2012-04-30 DIAGNOSIS — C50919 Malignant neoplasm of unspecified site of unspecified female breast: Secondary | ICD-10-CM

## 2012-04-30 HISTORY — PX: PORT-A-CATH REMOVAL: SHX5289

## 2012-04-30 SURGERY — MINOR REMOVAL PORT-A-CATH
Anesthesia: LOCAL | Site: Chest | Wound class: Clean

## 2012-04-30 MED ORDER — SODIUM BICARBONATE 4 % IV SOLN
INTRAVENOUS | Status: DC | PRN
  Administered 2012-04-30: 3 mL via INTRAVENOUS

## 2012-04-30 MED ORDER — LIDOCAINE-EPINEPHRINE (PF) 1 %-1:200000 IJ SOLN
INTRAMUSCULAR | Status: DC | PRN
  Administered 2012-04-30: 10 mL via INTRADERMAL

## 2012-04-30 SURGICAL SUPPLY — 31 items
BENZOIN TINCTURE PRP APPL 2/3 (GAUZE/BANDAGES/DRESSINGS) IMPLANT
BLADE SURG 15 STRL LF DISP TIS (BLADE) ×1 IMPLANT
BLADE SURG 15 STRL SS (BLADE) ×1
CHLORAPREP W/TINT 26ML (MISCELLANEOUS) ×2 IMPLANT
CLOTH BEACON ORANGE TIMEOUT ST (SAFETY) ×2 IMPLANT
DERMABOND ADVANCED (GAUZE/BANDAGES/DRESSINGS) ×1
DERMABOND ADVANCED .7 DNX12 (GAUZE/BANDAGES/DRESSINGS) ×1 IMPLANT
DRSG TEGADERM 4X4.75 (GAUZE/BANDAGES/DRESSINGS) IMPLANT
ELECT REM PT RETURN 9FT ADLT (ELECTROSURGICAL)
ELECTRODE REM PT RTRN 9FT ADLT (ELECTROSURGICAL) IMPLANT
GAUZE SPONGE 4X4 12PLY STRL LF (GAUZE/BANDAGES/DRESSINGS) IMPLANT
GAUZE SPONGE 4X4 16PLY XRAY LF (GAUZE/BANDAGES/DRESSINGS) IMPLANT
GLOVE BIO SURGEON STRL SZ7 (GLOVE) ×2 IMPLANT
GLOVE BIOGEL PI IND STRL 7.5 (GLOVE) ×1 IMPLANT
GLOVE BIOGEL PI INDICATOR 7.5 (GLOVE) ×1
GLOVE EUDERMIC 7 POWDERFREE (GLOVE) ×2 IMPLANT
GLOVE SKINSENSE NS SZ7.0 (GLOVE) ×1
GLOVE SKINSENSE STRL SZ7.0 (GLOVE) ×1 IMPLANT
MARKER SKIN DUAL TIP RULER LAB (MISCELLANEOUS) IMPLANT
NDL SAFETY ECLIPSE 18X1.5 (NEEDLE) ×1 IMPLANT
NEEDLE HYPO 18GX1.5 SHARP (NEEDLE) ×1
NEEDLE HYPO 25X1 1.5 SAFETY (NEEDLE) ×2 IMPLANT
PENCIL BUTTON HOLSTER BLD 10FT (ELECTRODE) IMPLANT
STRIP CLOSURE SKIN 1/2X4 (GAUZE/BANDAGES/DRESSINGS) IMPLANT
SUT MNCRL AB 4-0 PS2 18 (SUTURE) ×2 IMPLANT
SUT VIC AB 3-0 FS2 27 (SUTURE) IMPLANT
SUT VIC AB 4-0 BRD 54 (SUTURE) IMPLANT
SUT VIC AB 4-0 P-3 18XBRD (SUTURE) IMPLANT
SUT VIC AB 4-0 P3 18 (SUTURE)
SUT VIC AB 4-0 SH 18 (SUTURE) IMPLANT
SYR CONTROL 10ML LL (SYRINGE) ×2 IMPLANT

## 2012-04-30 NOTE — H&P (View-Only) (Signed)
NAME: Jodi Middleton       DOB: [birth year]           DATE: 04/29/2012       MRN: [id number]   Jodi Middleton is a 51 y.o.Marland Kitchenfemale who presents for routine followup of her Stage II right breast cancer and stage II left breast cancer diagnosed in 2010 and 2012 respectively and treated with Mastectomies. She has no problems or concerns on either side.She has completed her chemotherapy and her hair has started to regrow. She would like to have her port removed.  PFSH: She has had no significant changes since the last visit here.  ROS: There have been no significant changes since the last visit here  EXAM:  VS: BP 124/80  Pulse 80  Resp 16  Ht 5\' 5"  (1.651 m)  Wt 160 lb (72.576 kg)  BMI 26.63 kg/m2   General: The patient is alert, oriented, generally healthy appearing, NAD. Mood and affect are normal.  Breasts:  Status post bilateral mastectomies. There is no evidence of local recurrence is.  Lymphatics: She has no axillary or supraclavicular adenopathy on either side.  Extremities: Full ROM of the surgical side with no lymphedema noted.  Data Reviewed: No new data  Impression: Doing well, with no evidence of recurrent cancer or new cancer  Plan: Will continue to follow up on an annual basis here.We'll schedule Port-A-Cath removal under local anesthesia at the patient's convenience

## 2012-04-30 NOTE — Op Note (Signed)
Jodi Middleton [facility] December 07, 1960 [id number] 04/29/2012  Preoperative diagnosis: Un-Needed PAC  Postoperative diagnosis: Same  Procedure: Portacath Removal  Surgeon: Currie Paris, MD, FACS  Anesthesia:local   Clinical History and Indications: The patient has finished her chemotherapy and no longer needs a port. She wishes to have it removed.  Procedure: The patient was seen in the preoperative area and we confirmed the plans for the procedure as noted above. The Port-A-Cath site was identified and marked. The patient had no further questions.  The patient was then taken into the procedure room. The timeout was done. The area over the Port-A-Cath was anesthetized with 1% Xylocaine with epinephrine. I waited about 10 minutes and then the area was prepped and draped.  The old scar was opened. The capsule around the port opened and the port identified. The holding sutures were cut. The catheter was backed partially out of its tract. A figure 8 3-0 Vicryl suture was placed, the tubing removed, and the suture tied down to prevent backbleeding.  The port was then removed from its pocket. I made sure everything was dry. The incision was closed with 3-0 Vicryl, 4-0 Monocryl subcuticular, and Dermabond.  The patient tolerated the procedure well. There were no complications.  Currie Paris, MD, FACS 04/30/2012 8:47 AM

## 2012-04-30 NOTE — Interval H&P Note (Signed)
History and Physical Interval Note:  04/30/2012 8:04 AM  Jodi Middleton  has presented today for surgery, with the diagnosis of un-needed portacath  The various methods of treatment have been discussed with the patient and family. After consideration of risks, benefits and other options for treatment, the patient has consented to  Procedure(s) (LRB): MINOR REMOVAL PORT-A-CATH (N/A) as a surgical intervention .  The patient's history has been reviewed, patient examined, no change in status, stable for surgery.  I have reviewed the patient's chart and labs.  Questions were answered to the patient's satisfaction.  The port site was marked   [name]

## 2012-05-01 ENCOUNTER — Encounter (HOSPITAL_BASED_OUTPATIENT_CLINIC_OR_DEPARTMENT_OTHER): Payer: Self-pay | Admitting: Surgery

## 2015-04-12 ENCOUNTER — Encounter: Payer: Self-pay | Admitting: Genetic Counselor

## 2015-09-08 ENCOUNTER — Encounter: Payer: Self-pay | Admitting: Internal Medicine

## 2017-01-25 DIAGNOSIS — R634 Abnormal weight loss: Secondary | ICD-10-CM | POA: Diagnosis not present

## 2017-01-25 DIAGNOSIS — Z853 Personal history of malignant neoplasm of breast: Secondary | ICD-10-CM | POA: Diagnosis not present

## 2017-01-25 DIAGNOSIS — R938 Abnormal findings on diagnostic imaging of other specified body structures: Secondary | ICD-10-CM | POA: Diagnosis not present

## 2019-05-28 DIAGNOSIS — C50311 Malignant neoplasm of lower-inner quadrant of right female breast: Secondary | ICD-10-CM

## 2019-05-28 DIAGNOSIS — C50412 Malignant neoplasm of upper-outer quadrant of left female breast: Secondary | ICD-10-CM | POA: Diagnosis not present

## 2020-05-18 DIAGNOSIS — C50311 Malignant neoplasm of lower-inner quadrant of right female breast: Secondary | ICD-10-CM

## 2020-05-19 ENCOUNTER — Encounter: Payer: Self-pay | Admitting: Genetic Counselor

## 2020-12-24 ENCOUNTER — Other Ambulatory Visit: Payer: Self-pay | Admitting: Hematology and Oncology

## 2020-12-24 DIAGNOSIS — F32A Depression, unspecified: Secondary | ICD-10-CM

## 2021-05-18 ENCOUNTER — Ambulatory Visit: Payer: BC Managed Care – PPO | Admitting: Hematology and Oncology

## 2021-06-07 ENCOUNTER — Other Ambulatory Visit: Payer: Self-pay | Admitting: Hematology and Oncology

## 2021-06-07 ENCOUNTER — Telehealth: Payer: Self-pay | Admitting: Oncology

## 2021-06-07 ENCOUNTER — Inpatient Hospital Stay: Payer: BC Managed Care – PPO

## 2021-06-07 ENCOUNTER — Encounter: Payer: Self-pay | Admitting: Hematology and Oncology

## 2021-06-07 ENCOUNTER — Inpatient Hospital Stay: Payer: BC Managed Care – PPO | Attending: Hematology and Oncology | Admitting: Hematology and Oncology

## 2021-06-07 DIAGNOSIS — R5383 Other fatigue: Secondary | ICD-10-CM | POA: Insufficient documentation

## 2021-06-07 DIAGNOSIS — C50919 Malignant neoplasm of unspecified site of unspecified female breast: Secondary | ICD-10-CM | POA: Diagnosis not present

## 2021-06-07 DIAGNOSIS — Z9011 Acquired absence of right breast and nipple: Secondary | ICD-10-CM | POA: Diagnosis not present

## 2021-06-07 DIAGNOSIS — Z90710 Acquired absence of both cervix and uterus: Secondary | ICD-10-CM | POA: Insufficient documentation

## 2021-06-07 DIAGNOSIS — Z853 Personal history of malignant neoplasm of breast: Secondary | ICD-10-CM | POA: Insufficient documentation

## 2021-06-07 DIAGNOSIS — Z79899 Other long term (current) drug therapy: Secondary | ICD-10-CM | POA: Diagnosis not present

## 2021-06-07 LAB — CBC AND DIFFERENTIAL
HCT: 40 (ref 36–46)
Hemoglobin: 13.3 (ref 12.0–16.0)
Neutrophils Absolute: 3.7
Platelets: 231 (ref 150–399)
WBC: 5.6

## 2021-06-07 LAB — BASIC METABOLIC PANEL
BUN: 16 (ref 4–21)
CO2: 23 — AB (ref 13–22)
Chloride: 104 (ref 99–108)
Creatinine: 0.6 (ref 0.5–1.1)
Glucose: 119
Potassium: 3.8 (ref 3.4–5.3)
Sodium: 139 (ref 137–147)

## 2021-06-07 LAB — TSH: TSH: 2.161 u[IU]/mL (ref 0.350–4.500)

## 2021-06-07 LAB — HEPATIC FUNCTION PANEL
ALT: 20 (ref 7–35)
AST: 26 (ref 13–35)
Alkaline Phosphatase: 72 (ref 25–125)
Bilirubin, Total: 0.6

## 2021-06-07 LAB — CBC: RBC: 4.67 (ref 3.87–5.11)

## 2021-06-07 LAB — COMPREHENSIVE METABOLIC PANEL
Albumin: 4.6 (ref 3.5–5.0)
Calcium: 9.6 (ref 8.7–10.7)

## 2021-06-07 NOTE — Progress Notes (Addendum)
[facility]  [address] [place],  [place]  [address] [phone]  Clinic Day:  06/07/2021  Referring physician: Jerene Canny, MD  ASSESSMENT & PLAN:   Assessment & Plan: History of bilateral breast cancer The patient remains without evidence of recurrence. We will plan to see her back in 1 year with a CBC and comprehensive metabolic panel for repeat clinical assessment.  Fatigue She reports fatigue, most likely due to recently working 12 hour days out of town. As her hemoglobin is normal, I will add a TSH today.    The patient understands the plans discussed today and is in agreement with them.  She knows to contact our office if she develops concerns prior to her next appointment.     Jodi Pickles, PA-C    Orders Placed This Encounter  Procedures   CBC with Differential ([facility] Only)    Standing Status:   Future    Number of Occurrences:   1    Standing Expiration Date:   06/07/2022   CMP ([facility] only)    Standing Status:   Future    Number of Occurrences:   1    Standing Expiration Date:   06/07/2022   TSH    Standing Status:   Future    Number of Occurrences:   1    Standing Expiration Date:   06/07/2022      CHIEF COMPLAINT:  CC:   History of bilateral breast cancer  Current Treatment:   Observation   HISTORY OF PRESENT ILLNESS:  The patient is a 60 year old woman with a history of bilateral breast cancer.  She originally had a stage IIA (T1c N1a M0) multifocal hormone receptor positive right breast cancer diagnosed in July 2010.  She was treated with right mastectomy.  The largest tumor was 1.5 cm, grade 3, invasive ductal carcinoma with 2 of 8 nodes positive for metastasis.  Estrogen receptors were positive and her 2 Neu negative with a low risk Oncotype DX score.  She received radiation to the right chest wall and was placed on letrozole 2.5 mg daily along with Zoladex injections.  She transferred her care to  [facility] in September 2010.  We continued the same treatment and she was due to complete her 2 years of Zoladex in August 2012. She continued letrozole.  She then developed a rapidly enlarging left breast mass in May 2012.  This was found to be a stage IIIA (T2 N2a M0) her 2 Neu receptor positive breast cancer.  She was treated with left mastectomy.  Pathology revealed a 2.5 cm, grade 3, invasive ductal carcinoma with 4 of 4 nodes positive for metastasis.  Estrogen and progesterone receptors were negative and her 2 Neu positive.  The hormonal therapies were then discontinued.  She received adjuvant chemotherapy with docetaxel, carboplatin, and trastuzumab for 6 cycles and completed a full year of trastuzumab in July 2013.  She received adjuvant radiation to the left chest wall and axilla.  She underwent bilateral salpingo-oophorectomy in October 2013.  She had genetic testing, which did not reveal any clinically significant mutation or variants of uncertain significance.  She had had a transabdominal/transvaginal ultrasound of the pelvis in October 2017 due to pelvic pain, which revealed endometrial thickening.  She underwent D&C in November 2017.  Pathology was consistent with leiomyomas, no hyperplasia, atypia or malignancy was seen. She had a colonoscopy in May 2012, which was normal, so follow-up in 10 years  was recommended.   INTERVAL HISTORY:  Jodi Middleton is here today for repeat clinical assessment. Other than fatigue, she denies any complaints.  She has recently returned from working 12 hour days out of town for 2 weeks, so this is likely the cause of her fatigue.  She denies any changes in her mastectomy sites. She denies fevers or chills. She denies pain. Her appetite is good. Her weight has increased 5 pounds over last year . She is overdue for screening colonoscopy, but would prefer not to do this.  She does not have a family history of colorectal cancer.  I asked her to discuss Cologuard  with her primary care provider.  REVIEW OF SYSTEMS:  Review of Systems  Constitutional:  Positive for fatigue. Negative for appetite change, chills, fever and unexpected weight change.  HENT:   Negative for lump/mass, mouth sores and sore throat.   Respiratory:  Negative for cough and shortness of breath.   Cardiovascular:  Negative for chest pain and leg swelling.  Gastrointestinal:  Negative for abdominal pain, constipation, diarrhea, nausea and vomiting.  Endocrine: Negative for hot flashes.  Genitourinary:  Negative for difficulty urinating, dysuria, frequency and hematuria.   Musculoskeletal:  Negative for arthralgias, back pain and myalgias.  Skin:  Negative for rash.  Neurological:  Negative for dizziness and headaches.  Hematological:  Negative for adenopathy. Does not bruise/bleed easily.  Psychiatric/Behavioral:  Negative for depression and sleep disturbance. The patient is not nervous/anxious.     VITALS:  Blood pressure 116/68, pulse 72, temperature 98.3 F (36.8 C), temperature source Oral, resp. rate 18, height '5\' 6"'$  (1.676 m), weight 155 lb 6.4 oz (70.5 kg), SpO2 98 %.  Wt Readings from Last 3 Encounters:  06/07/21 155 lb 6.4 oz (70.5 kg)  04/29/12 160 lb (72.6 kg)  04/19/11 145 lb 3.2 oz (65.9 kg)    Body mass index is 25.08 kg/m.  Performance status (ECOG): 1 - Symptomatic but completely ambulatory  PHYSICAL EXAM:  Physical Exam Vitals and nursing note reviewed.  Constitutional:      General: She is not in acute distress.    Appearance: Normal appearance.  HENT:     Head: Normocephalic and atraumatic.     Mouth/Throat:     Mouth: Mucous membranes are moist.     Pharynx: Oropharynx is clear. No oropharyngeal exudate or posterior oropharyngeal erythema.  Eyes:     General: No scleral icterus.    Extraocular Movements: Extraocular movements intact.     Conjunctiva/sclera: Conjunctivae normal.     Pupils: Pupils are equal, round, and reactive to light.   Cardiovascular:     Rate and Rhythm: Normal rate and regular rhythm.     Heart sounds: Normal heart sounds. No murmur heard.   No friction rub. No gallop.  Pulmonary:     Effort: Pulmonary effort is normal.     Breath sounds: Normal breath sounds. No wheezing, rhonchi or rales.  Chest:  Breasts:    Right: Absent.     Left: Absent.     Comments:   Bilateral mastectomy sites are negative Abdominal:     General: There is no distension.     Palpations: Abdomen is soft. There is no hepatomegaly, splenomegaly or mass.     Tenderness: There is no abdominal tenderness.  Musculoskeletal:        General: Normal range of motion.     Cervical back: Normal range of motion and neck supple. No tenderness.     Right lower  leg: No edema.     Left lower leg: No edema.  Lymphadenopathy:     Cervical: No cervical adenopathy.     Upper Body:     Right upper body: No supraclavicular or axillary adenopathy.     Left upper body: No supraclavicular or axillary adenopathy.     Lower Body: No right inguinal adenopathy. No left inguinal adenopathy.  Skin:    General: Skin is warm and dry.     Coloration: Skin is not jaundiced.     Findings: No rash.  Neurological:     Mental Status: She is alert and oriented to person, place, and time.     Cranial Nerves: No cranial nerve deficit.  Psychiatric:        Mood and Affect: Mood normal.        Behavior: Behavior normal.        Thought Content: Thought content normal.   LABS:   CBC Latest Ref Rng & Units 04/10/2011 09/02/2009 08/03/2009  WBC 4.0 - 10.5 K/uL 7.5 4.7 6.7  Hemoglobin 12.0 - 15.0 g/dL 12.8 13.5 12.5  Hematocrit 36.0 - 46.0 % 39.2 40.0 37.1  Platelets 150 - 400 K/uL 276 229 248   CMP Latest Ref Rng & Units 04/10/2011 09/02/2009 08/03/2009  Glucose 70 - 99 mg/dL 90 128(H) 94  BUN 6 - 23 mg/dL '10 11 12  '$ Creatinine 0.50 - 1.10 mg/dL 0.48(L) 0.67 0.60  Sodium 135 - 145 mEq/L 140 139 142  Potassium 3.5 - 5.1 mEq/L 3.7 3.8 4.0  Chloride 96 - 112  mEq/L 102 103 105  CO2 19 - 32 mEq/L '28 25 27  '$ Calcium 8.4 - 10.5 mg/dL 10.0 9.2 9.8  Total Protein 6.0 - 8.3 g/dL - 6.8 -  Total Bilirubin 0.3 - 1.2 mg/dL - 0.6 -  Alkaline Phos 39 - 117 U/L - 54 -  AST 0 - 37 U/L - 13 -  ALT 0 - 35 U/L - 16 -     No results found for: CEA1 / No results found for: CEA1 No results found for: PSA1 No results found for: [id number] No results found for: CAN125  No results found for: TOTALPROTELP, ALBUMINELP, A1GS, A2GS, BETS, BETA2SER, GAMS, MSPIKE, SPEI No results found for: TIBC, FERRITIN, IRONPCTSAT Lab Results  Component Value Date   LDH 129 09/02/2009   LDH 122 06/15/2009    STUDIES:  No results found.    HISTORY:   Past Medical History:  Diagnosis Date   Breast pain    Cancer [facility])     Past Surgical History:  Procedure Laterality Date   MASTECTOMY  05/26/2009   right   MASTECTOMY MODIFIED RADICAL  04/11/2011   left   PORT-A-CATH REMOVAL  04/30/2012   Procedure: MINOR REMOVAL PORT-A-CATH;  Surgeon: Haywood Lasso, MD;  Location: [facility];  Service: General;  Laterality: N/A;   UTERINE FIBROID SURGERY N/A 06/23/2009   surgery prior to date    No family history on file.  Social History:  reports that she has never smoked. She has never used smokeless tobacco. She reports that she does not drink alcohol and does not use drugs.The patient is alone today.  Allergies:  Allergies  Allergen Reactions   Sulfonamide Derivatives Anaphylaxis    Current Medications: Current Outpatient Medications  Medication Sig Dispense Refill   dextromethorphan-guaiFENesin (MUCINEX DM) 30-600 MG 12hr tablet Take 1 tablet by mouth as directed.     potassium bicarbonate (K-LYTE) 25 MEQ disintegrating tablet  Take 20 mEq by mouth as directed.     diphenhydrAMINE (BENADRYL) 25 mg capsule Take by mouth.     Multiple Vitamin (MULTIVITAMIN) tablet Take 1 tablet by mouth daily.     naproxen sodium (ANAPROX) 220 MG tablet Take 220 mg by  mouth as needed.     No current facility-administered medications for this visit.

## 2021-06-07 NOTE — Assessment & Plan Note (Addendum)
She reports fatigue, most likely due to recently working 12 hour days out of town. As her hemoglobin is normal, I will add a TSH today.

## 2021-06-07 NOTE — Assessment & Plan Note (Signed)
The patient remains without evidence of recurrence. We will plan to see her back in 1 year with a CBC and comprehensive metabolic panel for repeat clinical assessment.

## 2021-06-07 NOTE — Telephone Encounter (Signed)
Per 9/7 LOS, patient scheduled for Sept 2023 Appt's.  Mailing Appt Summary

## 2021-09-13 DIAGNOSIS — E785 Hyperlipidemia, unspecified: Secondary | ICD-10-CM | POA: Diagnosis not present

## 2021-09-13 DIAGNOSIS — M1990 Unspecified osteoarthritis, unspecified site: Secondary | ICD-10-CM | POA: Diagnosis not present

## 2021-09-13 DIAGNOSIS — Z6825 Body mass index (BMI) 25.0-25.9, adult: Secondary | ICD-10-CM | POA: Diagnosis not present

## 2021-10-18 DIAGNOSIS — M1990 Unspecified osteoarthritis, unspecified site: Secondary | ICD-10-CM | POA: Diagnosis not present

## 2021-10-18 DIAGNOSIS — Z6826 Body mass index (BMI) 26.0-26.9, adult: Secondary | ICD-10-CM | POA: Diagnosis not present

## 2021-10-18 DIAGNOSIS — E785 Hyperlipidemia, unspecified: Secondary | ICD-10-CM | POA: Diagnosis not present

## 2021-11-08 DIAGNOSIS — Z0000 Encounter for general adult medical examination without abnormal findings: Secondary | ICD-10-CM | POA: Diagnosis not present

## 2021-11-08 DIAGNOSIS — Z1211 Encounter for screening for malignant neoplasm of colon: Secondary | ICD-10-CM | POA: Diagnosis not present

## 2021-11-08 DIAGNOSIS — K5900 Constipation, unspecified: Secondary | ICD-10-CM | POA: Diagnosis not present

## 2021-11-08 DIAGNOSIS — Z124 Encounter for screening for malignant neoplasm of cervix: Secondary | ICD-10-CM | POA: Diagnosis not present

## 2021-11-08 DIAGNOSIS — K5909 Other constipation: Secondary | ICD-10-CM | POA: Diagnosis not present

## 2021-11-08 DIAGNOSIS — E785 Hyperlipidemia, unspecified: Secondary | ICD-10-CM | POA: Diagnosis not present

## 2021-11-22 DIAGNOSIS — Z1212 Encounter for screening for malignant neoplasm of rectum: Secondary | ICD-10-CM | POA: Diagnosis not present

## 2021-11-22 DIAGNOSIS — Z1211 Encounter for screening for malignant neoplasm of colon: Secondary | ICD-10-CM | POA: Diagnosis not present

## 2022-04-19 DIAGNOSIS — K581 Irritable bowel syndrome with constipation: Secondary | ICD-10-CM | POA: Diagnosis not present

## 2022-05-22 DIAGNOSIS — Z1331 Encounter for screening for depression: Secondary | ICD-10-CM | POA: Diagnosis not present

## 2022-05-22 DIAGNOSIS — M1990 Unspecified osteoarthritis, unspecified site: Secondary | ICD-10-CM | POA: Diagnosis not present

## 2022-05-22 DIAGNOSIS — K581 Irritable bowel syndrome with constipation: Secondary | ICD-10-CM | POA: Diagnosis not present

## 2022-06-01 ENCOUNTER — Other Ambulatory Visit: Payer: Self-pay

## 2022-06-06 NOTE — Assessment & Plan Note (Signed)
Bilateral breast cancer. 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  She was treated right mastectomy, adjuvant radiation therapy and 5 years of hormonal therapy.  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  She was treated with left mastectomy, adjuvant chemotherapy and HER2 targeted therapy, and adjuvant radiation therapy.  She has never had evidence of recurrence.  She remains without evidence of recurrence. We will plan to see her back in 1 year with a CBC and comprehensive metabolic panel for repeat clinical assessment.

## 2022-06-06 NOTE — Progress Notes (Signed)
[facility]  [address] [place],  [place]  [address] [phone]  Clinic Day:  06/06/2022  Referring physician: Jerene Canny, MD  ASSESSMENT & PLAN:   Assessment & Plan: History of bilateral breast cancer Bilateral breast cancer. 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  She was treated right mastectomy, adjuvant radiation therapy and 5 years of hormonal therapy.  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  She was treated with left mastectomy, adjuvant chemotherapy and HER2 targeted therapy, and adjuvant radiation therapy.  She has never had evidence of recurrence.  She remains without evidence of recurrence. We will plan to see her back in 1 year with a CBC and comprehensive metabolic panel for repeat clinical assessment.    The patient understands the plans discussed today and is in agreement with them.  She knows to contact our office if she develops concerns prior to her next appointment.   I provided 10 minutes of face-to-face time during this encounter and > 50% was spent counseling as documented under my assessment and plan.    Jodi Pickles, PA-C  [facility] [address] [place] [address] Dept: [phone] Dept Fax: [phone]   No orders of the defined types were placed in this encounter.     CHIEF COMPLAINT:  CC: Remote history of bilateral breast cancer  Current Treatment:  Observation  HISTORY OF PRESENT ILLNESS:  The patient is a 61 year old woman with a history of bilateral breast cancer.  She originally had a stage IIA (T1c N1a M0) multifocal hormone receptor positive right breast cancer diagnosed in July 2010.  She was treated with right mastectomy.  The largest tumor was 1.5 cm, grade 3, invasive ductal carcinoma with 2 of 8 nodes positive for metastasis.  Estrogen receptors  were positive and her 2 Neu negative with a low risk Oncotype DX score.  She received radiation to the right chest wall and was placed on letrozole 2.5 mg daily along with Zoladex injections.  She transferred her care to [facility] in September 2010.  We continued the same treatment and she was due to complete her 2 years of Zoladex in August 2012. She continued letrozole.  She then developed a rapidly enlarging left breast mass in May 2012.  This was found to be a stage IIIA (T2 N2a M0) her 2 Neu receptor positive breast cancer.  She was treated with left mastectomy.  Pathology revealed a 2.5 cm, grade 3, invasive ductal carcinoma with 4 of 4 nodes positive for metastasis.  Estrogen and progesterone receptors were negative and her 2 Neu positive.  The hormonal therapies were then discontinued.  She received adjuvant chemotherapy with docetaxel, carboplatin, and trastuzumab for 6 cycles and completed a full year of trastuzumab in July 2013.  She received adjuvant radiation to the left chest wall and axilla.  She underwent bilateral salpingo-oophorectomy in October 2013.  She had genetic testing, which did not reveal any clinically significant mutation or variants of uncertain significance.  She had a transabdominal/transvaginal ultrasound of the pelvis in October 2017 due to pelvic pain, which revealed endometrial thickening.  She underwent D&C in November 2017.  Pathology was consistent with leiomyomas, no hyperplasia, atypia or malignancy was seen. She had a colonoscopy in May 2012, which was normal, so follow-up in 10 years was recommended.   INTERVAL HISTORY:  Jodi Middleton is  here today for repeat clinical assessment and states she has been doing well.  She denies any changes in her mastectomy site.  She denies any new symptoms such as cough, bone pain or neurologic symptoms.  The level was good.  She denies fevers or chills. She denies pain. Her appetite is good. Her weight has decreased 5 pounds over  last year .  She is up-to-date on pelvic and Pap smear through her PCP.  She had Cologuard testing was year which was negative.  REVIEW OF SYSTEMS:  Review of Systems  Constitutional:  Negative for appetite change, chills, fatigue, fever and unexpected weight change.  HENT:   Negative for lump/mass, mouth sores and sore throat.   Respiratory:  Negative for cough and shortness of breath.   Cardiovascular:  Negative for chest pain and leg swelling.  Gastrointestinal:  Negative for abdominal pain, constipation, diarrhea, nausea and vomiting.  Endocrine: Negative for hot flashes.  Genitourinary:  Negative for difficulty urinating, dysuria, frequency and hematuria.   Musculoskeletal:  Negative for arthralgias, back pain and myalgias.  Skin:  Negative for rash.  Neurological:  Negative for dizziness and headaches.  Hematological:  Negative for adenopathy. Does not bruise/bleed easily.  Psychiatric/Behavioral:  Negative for depression and sleep disturbance. The patient is not nervous/anxious.      VITALS:  There were no vitals taken for this visit.  Wt Readings from Last 3 Encounters:  06/07/21 155 lb 6.4 oz (70.5 kg)  04/29/12 160 lb (72.6 kg)  04/19/11 145 lb 3.2 oz (65.9 kg)    There is no height or weight on file to calculate BMI.  Performance status (ECOG): 0 - Asymptomatic  PHYSICAL EXAM:  Physical Exam Vitals and nursing note reviewed.  Constitutional:      General: She is not in acute distress.    Appearance: Normal appearance.  HENT:     Head: Normocephalic and atraumatic.     Mouth/Throat:     Mouth: Mucous membranes are moist.     Pharynx: Oropharynx is clear. No oropharyngeal exudate or posterior oropharyngeal erythema.  Eyes:     General: No scleral icterus.    Extraocular Movements: Extraocular movements intact.     Conjunctiva/sclera: Conjunctivae normal.     Pupils: Pupils are equal, round, and reactive to light.  Cardiovascular:     Rate and Rhythm: Normal rate  and regular rhythm.     Heart sounds: Normal heart sounds. No murmur heard.    No friction rub. No gallop.  Pulmonary:     Effort: Pulmonary effort is normal.     Breath sounds: Normal breath sounds. No wheezing, rhonchi or rales.  Chest:  Breasts:    Right: Absent.     Left: Absent.     Comments: Bilateral mastectomy sites are negative Abdominal:     General: There is no distension.     Palpations: Abdomen is soft. There is no hepatomegaly, splenomegaly or mass.     Tenderness: There is no abdominal tenderness.  Musculoskeletal:        General: Normal range of motion.     Cervical back: Normal range of motion and neck supple. No tenderness.     Right lower leg: No edema.     Left lower leg: No edema.  Lymphadenopathy:     Cervical: No cervical adenopathy.     Upper Body:     Right upper body: No supraclavicular or axillary adenopathy.     Left upper body: No supraclavicular or axillary  adenopathy.     Lower Body: No right inguinal adenopathy. No left inguinal adenopathy.  Skin:    General: Skin is warm and dry.     Coloration: Skin is not jaundiced.     Findings: No rash.  Neurological:     Mental Status: She is alert and oriented to person, place, and time.     Cranial Nerves: No cranial nerve deficit.  Psychiatric:        Mood and Affect: Mood normal.        Behavior: Behavior normal.        Thought Content: Thought content normal.     LABS:      Latest Ref Rng & Units 06/07/2021   12:00 AM 04/10/2011    3:20 PM 09/02/2009   11:03 AM  CBC  WBC  5.6     7.5  4.7   Hemoglobin 12.0 - 16.0 13.3     12.8  13.5   Hematocrit 36 - 46 40     39.2  40.0   Platelets 150 - 399 231     276  229      This result is from an external source.      Latest Ref Rng & Units 06/07/2021   12:00 AM 04/10/2011    3:20 PM 09/02/2009   11:02 AM  CMP  Glucose 70 - 99 mg/dL  90  128   BUN 4 - 21 16     10  11    Creatinine 0.5 - 1.1 0.6     0.48  0.67   Sodium 137 - 147 139     140  139    Potassium 3.4 - 5.3 3.8     3.7  3.8   Chloride 99 - 108 104     102  103   CO2 13 - 22 23     28  25    Calcium 8.7 - 10.7 9.6     10.0  9.2   Total Protein 6.0 - 8.3 g/dL   6.8   Total Bilirubin 0.3 - 1.2 mg/dL   0.6   Alkaline Phos 25 - 125 72      54   AST 13 - 35 26      13   ALT 7 - 35 20      16      This result is from an external source.     No results found for: "CEA1", "CEA" / No results found for: "CEA1", "CEA" No results found for: "PSA1" No results found for: "ION629" No results found for: "CAN125"  No results found for: "TOTALPROTELP", "ALBUMINELP", "A1GS", "A2GS", "BETS", "BETA2SER", "GAMS", "MSPIKE", "SPEI" No results found for: "TIBC", "FERRITIN", "IRONPCTSAT" Lab Results  Component Value Date   LDH 129 09/02/2009   LDH 122 06/15/2009    STUDIES:  No results found.    HISTORY:   Past Medical History:  Diagnosis Date   Breast pain    Cancer [facility])     Past Surgical History:  Procedure Laterality Date   MASTECTOMY  05/26/2009   right   MASTECTOMY MODIFIED RADICAL  04/11/2011   left   PORT-A-CATH REMOVAL  04/30/2012   Procedure: MINOR REMOVAL PORT-A-CATH;  Surgeon: Haywood Lasso, MD;  Location: [facility];  Service: General;  Laterality: N/A;   UTERINE FIBROID SURGERY N/A 06/23/2009   surgery prior to date    No family history on file.  Social History:  reports that she  has never smoked. She has never used smokeless tobacco. She reports that she does not drink alcohol and does not use drugs.The patient is alone today.  Allergies:  Allergies  Allergen Reactions   Sulfonamide Derivatives Anaphylaxis   Sulfamethoxazole Nausea Only and Hives    Current Medications: Current Outpatient Medications  Medication Sig Dispense Refill   dextromethorphan-guaiFENesin (MUCINEX DM) 30-600 MG 12hr tablet Take 1 tablet by mouth as directed.     diphenhydrAMINE (BENADRYL) 25 mg capsule Take by mouth.     meloxicam (MOBIC) 15 MG tablet  Take 15 mg by mouth daily.     Multiple Vitamin (MULTIVITAMIN) tablet Take 1 tablet by mouth daily.     naproxen sodium (ANAPROX) 220 MG tablet Take 220 mg by mouth as needed.     potassium bicarbonate (K-LYTE) 25 MEQ disintegrating tablet Take 20 mEq by mouth as directed.     venlafaxine XR (EFFEXOR-XR) 75 MG 24 hr capsule Take 75 mg by mouth daily.     No current facility-administered medications for this visit.

## 2022-06-07 ENCOUNTER — Inpatient Hospital Stay: Payer: BC Managed Care – PPO | Attending: Hematology and Oncology

## 2022-06-07 ENCOUNTER — Inpatient Hospital Stay: Payer: BC Managed Care – PPO | Admitting: Hematology and Oncology

## 2022-06-07 ENCOUNTER — Encounter: Payer: Self-pay | Admitting: Hematology and Oncology

## 2022-06-07 ENCOUNTER — Other Ambulatory Visit: Payer: Self-pay | Admitting: Hematology and Oncology

## 2022-06-07 DIAGNOSIS — Z853 Personal history of malignant neoplasm of breast: Secondary | ICD-10-CM

## 2022-06-07 DIAGNOSIS — R5383 Other fatigue: Secondary | ICD-10-CM | POA: Diagnosis not present

## 2022-06-07 LAB — CBC: RBC: 4.66 (ref 3.87–5.11)

## 2022-06-07 LAB — CBC AND DIFFERENTIAL
HCT: 40 (ref 36–46)
Hemoglobin: 13.7 (ref 12.0–16.0)
Neutrophils Absolute: 6.91
Platelets: 320 10*3/uL (ref 150–400)
WBC: 9.6

## 2022-11-08 DIAGNOSIS — Z13220 Encounter for screening for lipoid disorders: Secondary | ICD-10-CM | POA: Diagnosis not present

## 2022-11-08 DIAGNOSIS — R634 Abnormal weight loss: Secondary | ICD-10-CM | POA: Diagnosis not present

## 2022-11-08 DIAGNOSIS — M1990 Unspecified osteoarthritis, unspecified site: Secondary | ICD-10-CM | POA: Diagnosis not present

## 2022-11-08 DIAGNOSIS — K581 Irritable bowel syndrome with constipation: Secondary | ICD-10-CM | POA: Diagnosis not present

## 2022-11-08 DIAGNOSIS — Z0000 Encounter for general adult medical examination without abnormal findings: Secondary | ICD-10-CM | POA: Diagnosis not present

## 2023-05-15 DIAGNOSIS — Z853 Personal history of malignant neoplasm of breast: Secondary | ICD-10-CM | POA: Diagnosis not present

## 2023-05-15 DIAGNOSIS — E538 Deficiency of other specified B group vitamins: Secondary | ICD-10-CM | POA: Diagnosis not present

## 2023-05-15 DIAGNOSIS — G479 Sleep disorder, unspecified: Secondary | ICD-10-CM | POA: Diagnosis not present

## 2023-05-15 DIAGNOSIS — E785 Hyperlipidemia, unspecified: Secondary | ICD-10-CM | POA: Diagnosis not present

## 2023-05-15 DIAGNOSIS — E611 Iron deficiency: Secondary | ICD-10-CM | POA: Diagnosis not present

## 2023-06-05 ENCOUNTER — Telehealth: Payer: Self-pay

## 2023-06-05 ENCOUNTER — Encounter: Payer: Self-pay | Admitting: Hematology and Oncology

## 2023-06-05 ENCOUNTER — Inpatient Hospital Stay: Payer: BC Managed Care – PPO | Attending: Hematology and Oncology | Admitting: Hematology and Oncology

## 2023-06-05 ENCOUNTER — Inpatient Hospital Stay: Payer: BC Managed Care – PPO

## 2023-06-05 VITALS — BP 117/81 | HR 86 | Temp 98.3°F | Resp 18 | Ht 66.0 in | Wt 152.5 lb

## 2023-06-05 DIAGNOSIS — Z13820 Encounter for screening for osteoporosis: Secondary | ICD-10-CM

## 2023-06-05 DIAGNOSIS — R5383 Other fatigue: Secondary | ICD-10-CM | POA: Diagnosis not present

## 2023-06-05 DIAGNOSIS — Z9013 Acquired absence of bilateral breasts and nipples: Secondary | ICD-10-CM | POA: Diagnosis not present

## 2023-06-05 DIAGNOSIS — Z791 Long term (current) use of non-steroidal anti-inflammatories (NSAID): Secondary | ICD-10-CM | POA: Diagnosis not present

## 2023-06-05 DIAGNOSIS — Z79899 Other long term (current) drug therapy: Secondary | ICD-10-CM | POA: Diagnosis not present

## 2023-06-05 DIAGNOSIS — Z853 Personal history of malignant neoplasm of breast: Secondary | ICD-10-CM | POA: Insufficient documentation

## 2023-06-05 LAB — CMP (CANCER CENTER ONLY)
ALT: 21 U/L (ref 0–44)
AST: 18 U/L (ref 15–41)
Albumin: 4.3 g/dL (ref 3.5–5.0)
Alkaline Phosphatase: 77 U/L (ref 38–126)
Anion gap: 10 (ref 5–15)
BUN: 10 mg/dL (ref 6–20)
CO2: 26 mmol/L (ref 22–32)
Calcium: 9.6 mg/dL (ref 8.9–10.3)
Chloride: 101 mmol/L (ref 98–111)
Creatinine: 0.57 mg/dL (ref 0.44–1.00)
GFR, Estimated: >60 mL/min (ref >60)
Glucose, Bld: 92 mg/dL (ref 70–99)
Potassium: 3.7 mmol/L (ref 3.5–5.1)
Sodium: 137 mmol/L (ref 135–145)
Total Bilirubin: 0.8 mg/dL (ref 0.3–1.2)
Total Protein: 7.4 g/dL (ref 6.5–8.1)

## 2023-06-05 LAB — CBC WITH DIFFERENTIAL (CANCER CENTER ONLY)
Abs Immature Granulocytes: 0.02 10*3/uL (ref 0.00–0.07)
Basophils Absolute: 0 10*3/uL (ref 0.0–0.1)
Basophils Relative: 0 %
Eosinophils Absolute: 0.1 10*3/uL (ref 0.0–0.5)
Eosinophils Relative: 2 %
HCT: 38.7 % (ref 36.0–46.0)
Hemoglobin: 12.2 g/dL (ref 12.0–15.0)
Immature Granulocytes: 0 %
Lymphocytes Relative: 20 %
Lymphs Abs: 1.6 10*3/uL (ref 0.7–4.0)
MCH: 27.7 pg (ref 26.0–34.0)
MCHC: 31.5 g/dL (ref 30.0–36.0)
MCV: 88 fL (ref 80.0–100.0)
Monocytes Absolute: 0.6 10*3/uL (ref 0.1–1.0)
Monocytes Relative: 7 %
Neutro Abs: 5.6 10*3/uL (ref 1.7–7.7)
Neutrophils Relative %: 71 %
Platelet Count: 373 10*3/uL (ref 150–400)
RBC: 4.4 MIL/uL (ref 3.87–5.11)
RDW: 14.1 % (ref 11.5–15.5)
WBC Count: 8 10*3/uL (ref 4.0–10.5)
nRBC: 0 % (ref 0.0–0.2)

## 2023-06-05 NOTE — Progress Notes (Signed)
[facility]  [address] [place],  [place]  [address] [phone]  Clinic Day:  06/05/2023  Referring physician: Lonie Peak, PA-C  ASSESSMENT & PLAN:   Assessment & Plan: History of bilateral breast cancer Bilateral breast cancer. 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  She was treated right mastectomy, adjuvant radiation therapy and 5 years of hormonal therapy.  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  She was treated with left mastectomy, adjuvant chemotherapy and HER2 targeted therapy, and adjuvant radiation therapy.  She has never had evidence of recurrence.  She remains without evidence of recurrence. We will plan to see her back in 1 year with a CBC and comprehensive metabolic panel for survivorship.    The patient understands the plans discussed today and is in agreement with them.  She knows to contact our office if she develops concerns prior to her next appointment.   I provided 15 minutes of face-to-face time during this encounter and > 50% was spent counseling as documented under my assessment and plan.    Adah Perl, PA-C  [facility] [address] [place] [address] Dept: [phone] Dept Fax: [phone]   Orders Placed This Encounter  Procedures   DG Bone Density    Standing Status:   Future    Standing Expiration Date:   06/04/2024    Scheduling Instructions:     RH    Order Specific Question:   Reason for Exam (SYMPTOM  OR DIAGNOSIS REQUIRED)    Answer:   screening for osteoporosis, s/p bilateral oophorectomy    Order Specific Question:   Is the patient pregnant?    Answer:   No    Order Specific Question:   Preferred imaging location?    Answer:   External   CBC with Differential (Cancer Center Only)    Standing Status:   Future    Number of Occurrences:   1     Standing Expiration Date:   06/04/2024   CMP (Cancer Center only)    Standing Status:   Future    Number of Occurrences:   1    Standing Expiration Date:   06/04/2024   CBC with Differential (Cancer Center Only)    Standing Status:   Future    Standing Expiration Date:   06/04/2024   CMP (Cancer Center only)    Standing Status:   Future    Standing Expiration Date:   06/04/2024   Amb Referral to Survivorship Long term    Standing Status:   Future    Standing Expiration Date:   06/04/2024    Referral Priority:   Routine    Referral Type:   Consultation    Referred to Provider:   Adah Perl, PA-C    Number of Visits Requested:   1      CHIEF COMPLAINT:  CC: history of bilateral breast cancer  Current Treatment:  surveillance  HISTORY OF PRESENT ILLNESS:  The patient is a 62 year old woman with a remote history of bilateral breast cancer.  She originally had a stage IIA (T1c N1a M0) multifocal hormone receptor positive right breast cancer diagnosed in July 2010.  She was treated with right mastectomy.  The largest tumor was 1.5 cm, grade 3, invasive ductal carcinoma with 2 of 8 nodes positive for metastasis.  Estrogen receptors were positive and her  2 Neu negative with a low risk Oncotype DX score.  She received radiation to the right chest wall and was placed on letrozole 2.5 mg daily along with Zoladex injections.  She transferred her care to here in September 2010.  We continued the same treatment and was due to complete her 2 years of Zoladex in August 2012. She continued letrozole.  She then developed a rapidly enlarging left breast mass in May 2012.  This was found to be a stage IIIA (T2 N2a M0) her 2 Neu receptor positive breast cancer.  She was treated with left mastectomy.  Pathology revealed a 2.5 cm, grade 3, invasive ductal carcinoma with 4 of 4 nodes positive for metastasis.  Estrogen and progesterone receptors were negative and her 2 Neu positive.  The hormonal therapies were then  discontinued.  She received adjuvant chemotherapy with docetaxel, carboplatin, and trastuzumab for 6 cycles and completed a full year of trastuzumab in July 2013.  She received adjuvant radiation to the left chest wall and axilla.  She underwent bilateral salpingo-oophorectomy in October 2013.  She had genetic testing, which did not reveal any clinically significant mutation or variants of uncertain significance.  She had a transabdominal/transvaginal ultrasound of the pelvis in October 2017 due to pelvic pain, which revealed endometrial thickening.  She underwent D&C in November 2017.  Pathology was consistent with leiomyomas, no hyperplasia, atypia or malignancy was seen. She had a colonoscopy in May 2012, which was normal, so follow-up in 10 years was recommended. She had Cologuard testing in 2023, which was apparently negative. Baseline bone density scan in June 2012 was normal. Repeat bone density scan in April 2017 was normal.  INTERVAL HISTORY:  Jodi Middleton is here today for repeat clinical assessment.  She denies any changes in her mastectomy sites.  She reports increased fatigue, not relieved with rest.  She states she is being scheduled for a sleep study by her PCP.  She denies any other new symptoms.  She denies fevers or chills. She denies pain. Her appetite is good. Her weight has decreased 1 pounds over last year .  She continues to work full-time.  REVIEW OF SYSTEMS:  Review of Systems  Constitutional:  Positive for fatigue. Negative for appetite change, chills, fever and unexpected weight change.  HENT:   Negative for lump/mass, mouth sores and sore throat.   Respiratory:  Negative for cough and shortness of breath.   Cardiovascular:  Negative for chest pain and leg swelling.  Gastrointestinal:  Negative for abdominal pain, constipation, diarrhea, nausea and vomiting.  Endocrine: Negative for hot flashes.  Genitourinary:  Negative for difficulty urinating, dysuria, frequency and hematuria.    Musculoskeletal:  Negative for arthralgias, back pain and myalgias.  Skin:  Negative for rash.  Neurological:  Negative for dizziness and headaches.  Hematological:  Negative for adenopathy. Does not bruise/bleed easily.  Psychiatric/Behavioral:  Negative for depression and sleep disturbance. The patient is not nervous/anxious.      VITALS:  Blood pressure 117/81, pulse 86, temperature 98.3 F (36.8 C), temperature source Oral, resp. rate 18, height 5\' 6"  (1.676 m), weight 152 lb 8 oz (69.2 kg), SpO2 100%.  Wt Readings from Last 3 Encounters:  06/05/23 152 lb 8 oz (69.2 kg)  06/07/22 153 lb 12.8 oz (69.8 kg)  06/07/21 155 lb 6.4 oz (70.5 kg)    Body mass index is 24.61 kg/m.  Performance status (ECOG): 1 - Symptomatic but completely ambulatory  PHYSICAL EXAM:  Physical Exam Vitals and nursing  note reviewed.  Constitutional:      General: She is not in acute distress.    Appearance: Normal appearance.  HENT:     Head: Normocephalic and atraumatic.     Mouth/Throat:     Mouth: Mucous membranes are moist.     Pharynx: Oropharynx is clear. No oropharyngeal exudate or posterior oropharyngeal erythema.  Eyes:     General: No scleral icterus.    Extraocular Movements: Extraocular movements intact.     Conjunctiva/sclera: Conjunctivae normal.     Pupils: Pupils are equal, round, and reactive to light.  Cardiovascular:     Rate and Rhythm: Normal rate and regular rhythm.     Heart sounds: Normal heart sounds. No murmur heard.    No friction rub. No gallop.  Pulmonary:     Effort: Pulmonary effort is normal.     Breath sounds: Normal breath sounds. No wheezing, rhonchi or rales.  Chest:  Breasts:    Right: Absent.     Left: Absent.     Comments: Bilateral mastectomy sites are negative Abdominal:     General: There is no distension.     Palpations: Abdomen is soft. There is no hepatomegaly, splenomegaly or mass.     Tenderness: There is no abdominal tenderness.   Musculoskeletal:        General: Normal range of motion.     Cervical back: Normal range of motion and neck supple. No tenderness.     Right lower leg: No edema.     Left lower leg: No edema.  Lymphadenopathy:     Cervical: No cervical adenopathy.     Upper Body:     Right upper body: No supraclavicular or axillary adenopathy.     Left upper body: No supraclavicular or axillary adenopathy.     Lower Body: No right inguinal adenopathy. No left inguinal adenopathy.  Skin:    General: Skin is warm and dry.     Coloration: Skin is not jaundiced.     Findings: No rash.  Neurological:     Mental Status: She is alert and oriented to person, place, and time.     Cranial Nerves: No cranial nerve deficit.  Psychiatric:        Mood and Affect: Mood normal.        Behavior: Behavior normal.        Thought Content: Thought content normal.     LABS:      Latest Ref Rng & Units 06/05/2023    8:33 AM 06/07/2022   12:00 AM 06/07/2021   12:00 AM  CBC  WBC 4.0 - 10.5 K/uL 8.0  9.6     5.6      Hemoglobin 12.0 - 15.0 g/dL 16.1  09.6     04.5      Hematocrit 36.0 - 46.0 % 38.7  40     40      Platelets 150 - 400 K/uL 373  320     231         This result is from an external source.      Latest Ref Rng & Units 06/05/2023    8:33 AM 06/07/2021   12:00 AM 04/10/2011    3:20 PM  CMP  Glucose 70 - 99 mg/dL 92   90   BUN 6 - 20 mg/dL 10  16     10    Creatinine 0.44 - 1.00 mg/dL 4.09  0.6     8.11   Sodium 135 -  145 mmol/L 137  139     140   Potassium 3.5 - 5.1 mmol/L 3.7  3.8     3.7   Chloride 98 - 111 mmol/L 101  104     102   CO2 22 - 32 mmol/L 26  23     28    Calcium 8.9 - 10.3 mg/dL 9.6  9.6     54.0   Total Protein 6.5 - 8.1 g/dL 7.4     Total Bilirubin 0.3 - 1.2 mg/dL 0.8     Alkaline Phos 38 - 126 U/L 77  72       AST 15 - 41 U/L 18  26       ALT 0 - 44 U/L 21  20          This result is from an external source.   Lab Results  Component Value Date   LDH 129 09/02/2009   LDH 122  06/15/2009    STUDIES:  No results found.    HISTORY:   Past Medical History:  Diagnosis Date   Breast pain    Cancer [facility])     Past Surgical History:  Procedure Laterality Date   MASTECTOMY  05/26/2009   right   MASTECTOMY MODIFIED RADICAL  04/11/2011   left   PORT-A-CATH REMOVAL  04/30/2012   Procedure: MINOR REMOVAL PORT-A-CATH;  Surgeon: Currie Paris, MD;  Location: [facility];  Service: General;  Laterality: N/A;   UTERINE FIBROID SURGERY N/A 06/23/2009   surgery prior to date    No family history on file.  Social History:  reports that she has never smoked. She has never used smokeless tobacco. She reports that she does not drink alcohol and does not use drugs.The patient is alone today.  Allergies:  Allergies  Allergen Reactions   Sulfonamide Derivatives Anaphylaxis   Sulfamethoxazole Nausea Only and Hives    Current Medications: Current Outpatient Medications  Medication Sig Dispense Refill   Naproxen Sodium 220 MG CAPS Take 1 capsule by mouth as needed.     dextromethorphan-guaiFENesin (MUCINEX DM) 30-600 MG 12hr tablet Take 1 tablet by mouth as directed. As needed     diphenhydrAMINE (BENADRYL) 25 mg capsule Take by mouth. As needed     meloxicam (MOBIC) 15 MG tablet Take 15 mg by mouth daily.     Multiple Vitamin (MULTIVITAMIN) tablet Take 1 tablet by mouth daily.     potassium bicarbonate (K-LYTE) 25 MEQ disintegrating tablet Take 20 mEq by mouth as directed. In Winter months only per patient     venlafaxine XR (EFFEXOR-XR) 75 MG 24 hr capsule Take 75 mg by mouth daily.     No current facility-administered medications for this visit.

## 2023-06-05 NOTE — Telephone Encounter (Signed)
Patient notified and voiced understanding.

## 2023-06-05 NOTE — Telephone Encounter (Signed)
-----   Message from Adah Perl sent at 06/05/2023 12:39 PM EDT ----- Please let her know her labs are normal. Also, I am going to schedule bone density scan. Thanks

## 2023-06-05 NOTE — Assessment & Plan Note (Addendum)
Bilateral breast cancer. 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  She was treated right mastectomy, adjuvant radiation therapy and 5 years of hormonal therapy.  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  She was treated with left mastectomy, adjuvant chemotherapy and HER2 targeted therapy, and adjuvant radiation therapy.  She has never had evidence of recurrence.  She remains without evidence of recurrence.  As she is status post bilateral oophorectomy, I will obtain a bone density scan in the upcoming weeks.  I will plan to see her back in 1 year with a CBC and comprehensive metabolic panel for survivorship.

## 2023-06-06 ENCOUNTER — Telehealth: Payer: Self-pay | Admitting: Hematology and Oncology

## 2023-06-06 NOTE — Telephone Encounter (Signed)
Patient has been scheduled. Aware of appt date and time.   Scheduling Message Entered by Belva Crome A on 06/05/2023 at  9:03 AM Priority: Routine <No visit type provided>  Department: [facility]  Provider:  Scheduling Notes:  1. Bone density scan next available  2. Labs and f/u with [facility] for survivorship in 1 year

## 2023-06-20 DIAGNOSIS — R140 Abdominal distension (gaseous): Secondary | ICD-10-CM | POA: Diagnosis not present

## 2023-06-20 DIAGNOSIS — R1084 Generalized abdominal pain: Secondary | ICD-10-CM | POA: Diagnosis not present

## 2023-06-20 DIAGNOSIS — Z1331 Encounter for screening for depression: Secondary | ICD-10-CM | POA: Diagnosis not present

## 2023-07-03 DIAGNOSIS — M8589 Other specified disorders of bone density and structure, multiple sites: Secondary | ICD-10-CM | POA: Diagnosis not present

## 2023-07-03 DIAGNOSIS — Z13820 Encounter for screening for osteoporosis: Secondary | ICD-10-CM | POA: Diagnosis not present

## 2023-07-03 DIAGNOSIS — M8588 Other specified disorders of bone density and structure, other site: Secondary | ICD-10-CM | POA: Diagnosis not present

## 2023-07-03 LAB — HM DEXA SCAN

## 2023-07-10 DIAGNOSIS — R5383 Other fatigue: Secondary | ICD-10-CM | POA: Diagnosis not present

## 2023-07-10 DIAGNOSIS — G4733 Obstructive sleep apnea (adult) (pediatric): Secondary | ICD-10-CM | POA: Diagnosis not present

## 2023-07-10 DIAGNOSIS — R053 Chronic cough: Secondary | ICD-10-CM | POA: Diagnosis not present

## 2023-07-18 ENCOUNTER — Encounter: Payer: Self-pay | Admitting: Hematology and Oncology

## 2023-07-18 DIAGNOSIS — M8580 Other specified disorders of bone density and structure, unspecified site: Secondary | ICD-10-CM | POA: Diagnosis not present

## 2023-07-22 ENCOUNTER — Telehealth: Payer: Self-pay

## 2023-07-22 NOTE — Telephone Encounter (Signed)
-----   Message from Adah Perl sent at 07/15/2023  8:51 PM EDT ----- Please let her know bone density shows mild thinning in the spine. Recommend she start calcium/vit D supplement daily. Thanks

## 2024-06-04 ENCOUNTER — Inpatient Hospital Stay: Payer: BC Managed Care – PPO | Admitting: Hematology and Oncology

## 2024-06-04 ENCOUNTER — Inpatient Hospital Stay: Payer: BC Managed Care – PPO

## 2024-06-18 ENCOUNTER — Inpatient Hospital Stay: Attending: Hematology and Oncology

## 2024-06-18 ENCOUNTER — Inpatient Hospital Stay (HOSPITAL_BASED_OUTPATIENT_CLINIC_OR_DEPARTMENT_OTHER): Admitting: Hematology and Oncology

## 2024-06-18 ENCOUNTER — Other Ambulatory Visit: Payer: Self-pay

## 2024-06-18 ENCOUNTER — Other Ambulatory Visit: Payer: Self-pay | Admitting: Hematology and Oncology

## 2024-06-18 ENCOUNTER — Telehealth: Payer: Self-pay | Admitting: Hematology and Oncology

## 2024-06-18 VITALS — BP 122/91 | HR 89 | Temp 98.9°F | Resp 16 | Wt 155.4 lb

## 2024-06-18 DIAGNOSIS — Z853 Personal history of malignant neoplasm of breast: Secondary | ICD-10-CM | POA: Insufficient documentation

## 2024-06-18 DIAGNOSIS — Z923 Personal history of irradiation: Secondary | ICD-10-CM | POA: Insufficient documentation

## 2024-06-18 DIAGNOSIS — Z9221 Personal history of antineoplastic chemotherapy: Secondary | ICD-10-CM | POA: Insufficient documentation

## 2024-06-18 DIAGNOSIS — Z9013 Acquired absence of bilateral breasts and nipples: Secondary | ICD-10-CM | POA: Insufficient documentation

## 2024-06-18 DIAGNOSIS — Z08 Encounter for follow-up examination after completed treatment for malignant neoplasm: Secondary | ICD-10-CM | POA: Diagnosis present

## 2024-06-18 LAB — CMP (CANCER CENTER ONLY)
ALT: 13 U/L (ref 0–44)
AST: 17 U/L (ref 15–41)
Albumin: 4.6 g/dL (ref 3.5–5.0)
Alkaline Phosphatase: 87 U/L (ref 38–126)
Anion gap: 16 — ABNORMAL HIGH (ref 5–15)
BUN: 11 mg/dL (ref 8–23)
CO2: 24 mmol/L (ref 22–32)
Calcium: 10 mg/dL (ref 8.9–10.3)
Chloride: 100 mmol/L (ref 98–111)
Creatinine: 0.68 mg/dL (ref 0.44–1.00)
GFR, Estimated: >60 mL/min (ref >60)
Glucose, Bld: 99 mg/dL (ref 70–99)
Potassium: 4.1 mmol/L (ref 3.5–5.1)
Sodium: 139 mmol/L (ref 135–145)
Total Bilirubin: 0.6 mg/dL (ref 0.0–1.2)
Total Protein: 7.6 g/dL (ref 6.5–8.1)

## 2024-06-18 LAB — CBC WITH DIFFERENTIAL (CANCER CENTER ONLY)
Abs Immature Granulocytes: 0.02 K/uL (ref 0.00–0.07)
Basophils Absolute: 0 K/uL (ref 0.0–0.1)
Basophils Relative: 0 %
Eosinophils Absolute: 0.2 K/uL (ref 0.0–0.5)
Eosinophils Relative: 2 %
HCT: 41.4 % (ref 36.0–46.0)
Hemoglobin: 13.5 g/dL (ref 12.0–15.0)
Immature Granulocytes: 0 %
Lymphocytes Relative: 22 %
Lymphs Abs: 1.6 K/uL (ref 0.7–4.0)
MCH: 28.4 pg (ref 26.0–34.0)
MCHC: 32.6 g/dL (ref 30.0–36.0)
MCV: 87 fL (ref 80.0–100.0)
Monocytes Absolute: 0.4 K/uL (ref 0.1–1.0)
Monocytes Relative: 6 %
Neutro Abs: 5.3 K/uL (ref 1.7–7.7)
Neutrophils Relative %: 70 %
Platelet Count: 339 K/uL (ref 150–400)
RBC: 4.76 MIL/uL (ref 3.87–5.11)
RDW: 13.5 % (ref 11.5–15.5)
WBC Count: 7.5 K/uL (ref 4.0–10.5)
nRBC: 0 % (ref 0.0–0.2)

## 2024-06-18 NOTE — Telephone Encounter (Signed)
 Patient has been scheduled for follow-up visit per 06/17/24 LOS.  Pt given an appt calendar with date and time.

## 2024-06-18 NOTE — Progress Notes (Signed)
 [facility]  [address] [place],  [place]  [address] [phone]  Clinic Day:  06/18/2024  Referring physician: Montey Lot, PA-C  ASSESSMENT & PLAN:   Assessment & Plan: Bilateral breast cancer. 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  She was treated right mastectomy, adjuvant radiation therapy and 5 years of hormonal therapy.  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  She was treated with left mastectomy, adjuvant chemotherapy and HER2 targeted therapy, and adjuvant radiation therapy.  She has never had evidence of recurrence.   She remains without evidence of recurrence. We will plan to see her back in 1 year with a CBC and comprehensive metabolic panel for survivorship.      The patient understands the plans discussed today and is in agreement with them.  She knows to contact our office if she develops concerns prior to her next appointment.   I provided 20 minutes of face-to-face time during this encounter and > 50% was spent counseling as documented under my assessment and plan.    Jodi DELENA Bach, NP  [facility] [address] [place] [address] Dept: [phone] Dept Fax: [phone]   No orders of the defined types were placed in this encounter.     CHIEF COMPLAINT:  CC: history of bilateral breast cancer  Current Treatment:  surveillance  HISTORY OF PRESENT ILLNESS:  The patient is a 63 year old woman with a remote history of bilateral breast cancer.  She originally had a stage IIA (T1c N1a M0) multifocal hormone receptor positive right breast cancer diagnosed in July 2010.  She was treated with right mastectomy.  The largest tumor was 1.5 cm, grade 3, invasive ductal carcinoma with 2 of 8 nodes positive for metastasis.  Estrogen receptors were positive and her 2 Neu negative with  a low risk Oncotype DX score.  She received radiation to the right chest wall and was placed on letrozole 2.5 mg daily along with Zoladex injections.  She transferred her care to here in September 2010.  We continued the same treatment and was due to complete her 2 years of Zoladex in August 2012. She continued letrozole.  She then developed a rapidly enlarging left breast mass in May 2012.  This was found to be a stage IIIA (T2 N2a M0) her 2 Neu receptor positive breast cancer.  She was treated with left mastectomy.  Pathology revealed a 2.5 cm, grade 3, invasive ductal carcinoma with 4 of 4 nodes positive for metastasis.  Estrogen and progesterone receptors were negative and her 2 Neu positive.  The hormonal therapies were then discontinued.  She received adjuvant chemotherapy with docetaxel, carboplatin, and trastuzumab for 6 cycles and completed a full year of trastuzumab in July 2013.  She received adjuvant radiation to the left chest wall and axilla.  She underwent bilateral salpingo-oophorectomy in October 2013.  She had genetic testing, which did not reveal any clinically significant mutation or variants of uncertain significance.  She had a transabdominal/transvaginal ultrasound of the pelvis in October 2017 due to pelvic pain, which revealed endometrial thickening.  She underwent D&C in November 2017.  Pathology was consistent with leiomyomas, no hyperplasia, atypia or malignancy was seen. She had a colonoscopy in May 2012, which was normal, so follow-up in 10 years was recommended. She had Cologuard testing in 2023, which was apparently negative. Baseline bone density scan  in June 2012 was normal. Repeat bone density scan in April 2017 was normal.  INTERVAL HISTORY:  Jodi Middleton is here today for repeat clinical assessment.  She denies any changes in her mastectomy sites.    She denies any other new symptoms.  She denies fevers or chills. She denies pain. Her appetite is good. Her weight has decreased 1  pounds over last year.  She continues to work full-time.  REVIEW OF SYSTEMS:  Review of Systems  Constitutional:  Positive for fatigue. Negative for appetite change, chills, fever and unexpected weight change.  HENT:   Negative for lump/mass, mouth sores and sore throat.   Respiratory:  Negative for cough and shortness of breath.   Cardiovascular:  Negative for chest pain and leg swelling.  Gastrointestinal:  Negative for abdominal pain, constipation, diarrhea, nausea and vomiting.  Endocrine: Negative for hot flashes.  Genitourinary:  Negative for difficulty urinating, dysuria, frequency and hematuria.   Musculoskeletal:  Negative for arthralgias, back pain and myalgias.  Skin:  Negative for rash.  Neurological:  Negative for dizziness and headaches.  Hematological:  Negative for adenopathy. Does not bruise/bleed easily.  Psychiatric/Behavioral:  Negative for depression and sleep disturbance. The patient is not nervous/anxious.      VITALS:  Blood pressure (!) 122/91, pulse 89, temperature 98.9 F (37.2 C), temperature source Oral, resp. rate 16, weight 155 lb 6.4 oz (70.5 kg), SpO2 98%.  Wt Readings from Last 3 Encounters:  06/18/24 155 lb 6.4 oz (70.5 kg)  06/05/23 152 lb 8 oz (69.2 kg)  06/07/22 153 lb 12.8 oz (69.8 kg)    Body mass index is 25.08 kg/m.  Performance status (ECOG): 1 - Symptomatic but completely ambulatory  PHYSICAL EXAM:  Physical Exam Vitals and nursing note reviewed.  Constitutional:      General: She is not in acute distress.    Appearance: Normal appearance.  HENT:     Head: Normocephalic and atraumatic.     Mouth/Throat:     Mouth: Mucous membranes are moist.     Pharynx: Oropharynx is clear. No oropharyngeal exudate or posterior oropharyngeal erythema.  Eyes:     General: No scleral icterus.    Extraocular Movements: Extraocular movements intact.     Conjunctiva/sclera: Conjunctivae normal.     Pupils: Pupils are equal, round, and reactive to  light.  Cardiovascular:     Rate and Rhythm: Normal rate and regular rhythm.     Heart sounds: Normal heart sounds. No murmur heard.    No friction rub. No gallop.  Pulmonary:     Effort: Pulmonary effort is normal.     Breath sounds: Normal breath sounds. No wheezing, rhonchi or rales.  Chest:  Breasts:    Right: Absent.     Left: Absent.     Comments: Bilateral mastectomy sites are negative Abdominal:     General: There is no distension.     Palpations: Abdomen is soft. There is no hepatomegaly, splenomegaly or mass.     Tenderness: There is no abdominal tenderness.  Musculoskeletal:        General: Normal range of motion.     Cervical back: Normal range of motion and neck supple. No tenderness.     Right lower leg: No edema.     Left lower leg: No edema.  Lymphadenopathy:     Cervical: No cervical adenopathy.     Upper Body:     Right upper body: No supraclavicular or axillary adenopathy.     Left  upper body: No supraclavicular or axillary adenopathy.     Lower Body: No right inguinal adenopathy. No left inguinal adenopathy.  Skin:    General: Skin is warm and dry.     Coloration: Skin is not jaundiced.     Findings: No rash.  Neurological:     Mental Status: She is alert and oriented to person, place, and time.     Cranial Nerves: No cranial nerve deficit.  Psychiatric:        Mood and Affect: Mood normal.        Behavior: Behavior normal.        Thought Content: Thought content normal.     LABS:      Latest Ref Rng & Units 06/18/2024    8:43 AM 06/05/2023    8:33 AM 06/07/2022   12:00 AM  CBC  WBC 4.0 - 10.5 K/uL 7.5  8.0  9.6      Hemoglobin 12.0 - 15.0 g/dL 86.4  87.7  86.2      Hematocrit 36.0 - 46.0 % 41.4  38.7  40      Platelets 150 - 400 K/uL 339  373  320         This result is from an external source.      Latest Ref Rng & Units 06/18/2024    8:43 AM 06/05/2023    8:33 AM 06/07/2021   12:00 AM  CMP  Glucose 70 - 99 mg/dL 99  92    BUN 8 - 23 mg/dL  11  10  16       Creatinine 0.44 - 1.00 mg/dL 9.31  9.42  0.6      Sodium 135 - 145 mmol/L 139  137  139      Potassium 3.5 - 5.1 mmol/L 4.1  3.7  3.8      Chloride 98 - 111 mmol/L 100  101  104      CO2 22 - 32 mmol/L 24  26  23       Calcium 8.9 - 10.3 mg/dL 89.9  9.6  9.6      Total Protein 6.5 - 8.1 g/dL 7.6  7.4    Total Bilirubin 0.0 - 1.2 mg/dL 0.6  0.8    Alkaline Phos 38 - 126 U/L 87  77  72      AST 15 - 41 U/L 17  18  26       ALT 0 - 44 U/L 13  21  20          This result is from an external source.   Lab Results  Component Value Date   LDH 129 09/02/2009   LDH 122 06/15/2009    STUDIES:  No results found.    HISTORY:   Past Medical History:  Diagnosis Date   Breast pain    Cancer [facility])     Past Surgical History:  Procedure Laterality Date   MASTECTOMY  05/26/2009   right   MASTECTOMY MODIFIED RADICAL  04/11/2011   left   PORT-A-CATH REMOVAL  04/30/2012   Procedure: MINOR REMOVAL PORT-A-CATH;  Surgeon: Sherlean JINNY Laughter, MD;  Location: [facility];  Service: General;  Laterality: N/A;   UTERINE FIBROID SURGERY N/A 06/23/2009   surgery prior to date    No family history on file.  Social History:  reports that she has never smoked. She has never used smokeless tobacco. She reports that she does not drink alcohol and does not use drugs.The  patient is alone today.  Allergies:  Allergies  Allergen Reactions   Sulfonamide Derivatives Anaphylaxis   Sulfamethoxazole Nausea Only and Hives    Current Medications: Current Outpatient Medications  Medication Sig Dispense Refill   dextromethorphan-guaiFENesin (MUCINEX DM) 30-600 MG 12hr tablet Take 1 tablet by mouth as directed. As needed     diphenhydrAMINE (BENADRYL) 25 mg capsule Take by mouth. As needed     meloxicam (MOBIC) 15 MG tablet Take 15 mg by mouth daily.     Multiple Vitamin (MULTIVITAMIN) tablet Take 1 tablet by mouth daily.     Naproxen Sodium 220 MG CAPS Take 1 capsule by mouth  as needed.     potassium bicarbonate (K-LYTE) 25 MEQ disintegrating tablet Take 20 mEq by mouth as directed. In Winter months only per patient     SUMAtriptan (IMITREX) 50 MG tablet Take 50 mg by mouth 2 (two) times daily as needed.     venlafaxine XR (EFFEXOR-XR) 75 MG 24 hr capsule Take 75 mg by mouth daily.     No current facility-administered medications for this visit.

## 2025-06-17 ENCOUNTER — Other Ambulatory Visit

## 2025-06-17 ENCOUNTER — Encounter: Admitting: Hematology and Oncology
